# Patient Record
Sex: Female | Born: 1980 | Race: White | Hispanic: No | Marital: Married | Smoking: Current every day smoker
Health system: Southern US, Community
[De-identification: ages and names within clinical notes are randomized; demographics above are authoritative.]

## PROBLEM LIST (undated history)

## (undated) DIAGNOSIS — E785 Hyperlipidemia, unspecified: Secondary | ICD-10-CM

## (undated) DIAGNOSIS — J301 Allergic rhinitis due to pollen: Secondary | ICD-10-CM

## (undated) DIAGNOSIS — E669 Obesity, unspecified: Secondary | ICD-10-CM

## (undated) DIAGNOSIS — E039 Hypothyroidism, unspecified: Secondary | ICD-10-CM

## (undated) DIAGNOSIS — K589 Irritable bowel syndrome without diarrhea: Secondary | ICD-10-CM

## (undated) DIAGNOSIS — F419 Anxiety disorder, unspecified: Secondary | ICD-10-CM

## (undated) DIAGNOSIS — F909 Attention-deficit hyperactivity disorder, unspecified type: Secondary | ICD-10-CM

## (undated) HISTORY — PX: APPENDECTOMY: SHX54

## (undated) HISTORY — DX: Hyperlipidemia, unspecified: E78.5

## (undated) HISTORY — DX: Anxiety disorder, unspecified: F41.9

## (undated) HISTORY — DX: Attention-deficit hyperactivity disorder, unspecified type: F90.9

## (undated) HISTORY — DX: Allergic rhinitis due to pollen: J30.1

## (undated) HISTORY — DX: Obesity, unspecified: E66.9

## (undated) HISTORY — PX: TONSILLECTOMY: SUR1361

## (undated) HISTORY — DX: Irritable bowel syndrome, unspecified: K58.9

## (undated) HISTORY — DX: Hypothyroidism, unspecified: E03.9

---
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
## 1999-07-22 ENCOUNTER — Other Ambulatory Visit: Admission: RE | Admit: 1999-07-22 | Discharge: 1999-07-22 | Payer: Self-pay | Admitting: Internal Medicine

## 1999-09-12 ENCOUNTER — Inpatient Hospital Stay (HOSPITAL_COMMUNITY): Admission: AD | Admit: 1999-09-12 | Discharge: 1999-09-12 | Payer: Self-pay | Admitting: Internal Medicine

## 1999-11-10 ENCOUNTER — Other Ambulatory Visit: Admission: RE | Admit: 1999-11-10 | Discharge: 1999-11-10 | Payer: Self-pay | Admitting: *Deleted

## 2000-04-14 ENCOUNTER — Inpatient Hospital Stay (HOSPITAL_COMMUNITY): Admission: AD | Admit: 2000-04-14 | Discharge: 2000-04-14 | Payer: Self-pay | Admitting: Obstetrics and Gynecology

## 2000-04-18 ENCOUNTER — Inpatient Hospital Stay (HOSPITAL_COMMUNITY): Admission: AD | Admit: 2000-04-18 | Discharge: 2000-04-18 | Payer: Self-pay | Admitting: Obstetrics and Gynecology

## 2000-06-14 ENCOUNTER — Inpatient Hospital Stay (HOSPITAL_COMMUNITY): Admission: AD | Admit: 2000-06-14 | Discharge: 2000-06-14 | Payer: Self-pay | Admitting: Obstetrics and Gynecology

## 2000-06-20 ENCOUNTER — Inpatient Hospital Stay (HOSPITAL_COMMUNITY): Admission: AD | Admit: 2000-06-20 | Discharge: 2000-06-20 | Payer: Self-pay | Admitting: Obstetrics and Gynecology

## 2000-06-20 ENCOUNTER — Inpatient Hospital Stay (HOSPITAL_COMMUNITY): Admission: AD | Admit: 2000-06-20 | Discharge: 2000-06-23 | Payer: Self-pay | Admitting: Obstetrics and Gynecology

## 2000-06-26 ENCOUNTER — Inpatient Hospital Stay (HOSPITAL_COMMUNITY): Admission: AD | Admit: 2000-06-26 | Discharge: 2000-06-26 | Payer: Self-pay | Admitting: Obstetrics and Gynecology

## 2000-10-21 ENCOUNTER — Emergency Department (HOSPITAL_COMMUNITY): Admission: EM | Admit: 2000-10-21 | Discharge: 2000-10-21 | Payer: Self-pay | Admitting: Neurology

## 2000-10-21 ENCOUNTER — Encounter: Payer: Self-pay | Admitting: Emergency Medicine

## 2000-11-30 ENCOUNTER — Other Ambulatory Visit: Admission: RE | Admit: 2000-11-30 | Discharge: 2000-11-30 | Payer: Self-pay | Admitting: Obstetrics and Gynecology

## 2001-01-28 ENCOUNTER — Encounter: Admission: RE | Admit: 2001-01-28 | Discharge: 2001-01-28 | Payer: Self-pay | Admitting: *Deleted

## 2001-01-28 ENCOUNTER — Encounter: Payer: Self-pay | Admitting: *Deleted

## 2001-06-06 ENCOUNTER — Ambulatory Visit: Admission: RE | Admit: 2001-06-06 | Discharge: 2001-06-06 | Payer: Self-pay | Admitting: *Deleted

## 2001-11-29 ENCOUNTER — Other Ambulatory Visit: Admission: RE | Admit: 2001-11-29 | Discharge: 2001-11-29 | Payer: Self-pay | Admitting: Obstetrics and Gynecology

## 2001-12-19 ENCOUNTER — Encounter: Payer: Self-pay | Admitting: Family Medicine

## 2001-12-19 ENCOUNTER — Encounter: Admission: RE | Admit: 2001-12-19 | Discharge: 2001-12-19 | Payer: Self-pay | Admitting: Family Medicine

## 2002-06-05 ENCOUNTER — Encounter: Payer: Self-pay | Admitting: Emergency Medicine

## 2002-06-05 ENCOUNTER — Emergency Department (HOSPITAL_COMMUNITY): Admission: EM | Admit: 2002-06-05 | Discharge: 2002-06-05 | Payer: Self-pay | Admitting: Emergency Medicine

## 2002-07-01 ENCOUNTER — Encounter: Admission: RE | Admit: 2002-07-01 | Discharge: 2002-07-01 | Payer: Self-pay

## 2002-09-08 ENCOUNTER — Ambulatory Visit (HOSPITAL_COMMUNITY): Admission: RE | Admit: 2002-09-08 | Discharge: 2002-09-08 | Payer: Self-pay | Admitting: Endocrinology

## 2002-09-08 ENCOUNTER — Encounter: Payer: Self-pay | Admitting: Endocrinology

## 2002-09-16 ENCOUNTER — Encounter: Payer: Self-pay | Admitting: Endocrinology

## 2002-09-16 ENCOUNTER — Ambulatory Visit (HOSPITAL_COMMUNITY): Admission: RE | Admit: 2002-09-16 | Discharge: 2002-09-16 | Payer: Self-pay | Admitting: Endocrinology

## 2003-01-16 ENCOUNTER — Encounter: Admission: RE | Admit: 2003-01-16 | Discharge: 2003-01-16 | Payer: Self-pay

## 2003-02-17 ENCOUNTER — Other Ambulatory Visit: Admission: RE | Admit: 2003-02-17 | Discharge: 2003-02-17 | Payer: Self-pay | Admitting: Obstetrics and Gynecology

## 2003-07-10 ENCOUNTER — Emergency Department (HOSPITAL_COMMUNITY): Admission: EM | Admit: 2003-07-10 | Discharge: 2003-07-10 | Payer: Self-pay | Admitting: Family Medicine

## 2003-10-01 ENCOUNTER — Ambulatory Visit (HOSPITAL_COMMUNITY): Admission: RE | Admit: 2003-10-01 | Discharge: 2003-10-01 | Payer: Self-pay | Admitting: Orthopedic Surgery

## 2004-05-04 ENCOUNTER — Other Ambulatory Visit: Admission: RE | Admit: 2004-05-04 | Discharge: 2004-05-04 | Payer: Self-pay | Admitting: Obstetrics and Gynecology

## 2005-05-01 HISTORY — PX: CHOLECYSTECTOMY: SHX55

## 2005-07-03 ENCOUNTER — Emergency Department (HOSPITAL_COMMUNITY): Admission: EM | Admit: 2005-07-03 | Discharge: 2005-07-04 | Payer: Self-pay | Admitting: Emergency Medicine

## 2005-07-10 ENCOUNTER — Ambulatory Visit (HOSPITAL_COMMUNITY): Admission: RE | Admit: 2005-07-10 | Discharge: 2005-07-10 | Payer: Self-pay | Admitting: Gastroenterology

## 2005-07-12 ENCOUNTER — Ambulatory Visit (HOSPITAL_COMMUNITY): Admission: RE | Admit: 2005-07-12 | Discharge: 2005-07-12 | Payer: Self-pay | Admitting: Gastroenterology

## 2005-08-03 ENCOUNTER — Encounter (INDEPENDENT_AMBULATORY_CARE_PROVIDER_SITE_OTHER): Payer: Self-pay | Admitting: Specialist

## 2005-08-03 ENCOUNTER — Ambulatory Visit (HOSPITAL_COMMUNITY): Admission: RE | Admit: 2005-08-03 | Discharge: 2005-08-03 | Payer: Self-pay | Admitting: General Surgery

## 2005-09-08 ENCOUNTER — Emergency Department (HOSPITAL_COMMUNITY): Admission: EM | Admit: 2005-09-08 | Discharge: 2005-09-08 | Payer: Self-pay | Admitting: Family Medicine

## 2006-01-14 ENCOUNTER — Emergency Department (HOSPITAL_COMMUNITY): Admission: EM | Admit: 2006-01-14 | Discharge: 2006-01-14 | Payer: Self-pay | Admitting: Family Medicine

## 2006-04-04 ENCOUNTER — Inpatient Hospital Stay (HOSPITAL_COMMUNITY): Admission: AD | Admit: 2006-04-04 | Discharge: 2006-04-04 | Payer: Self-pay | Admitting: Obstetrics and Gynecology

## 2006-04-13 ENCOUNTER — Inpatient Hospital Stay (HOSPITAL_COMMUNITY): Admission: AD | Admit: 2006-04-13 | Discharge: 2006-04-13 | Payer: Self-pay | Admitting: Obstetrics and Gynecology

## 2006-04-16 ENCOUNTER — Inpatient Hospital Stay (HOSPITAL_COMMUNITY): Admission: AD | Admit: 2006-04-16 | Discharge: 2006-04-16 | Payer: Self-pay | Admitting: Obstetrics and Gynecology

## 2006-04-19 ENCOUNTER — Inpatient Hospital Stay (HOSPITAL_COMMUNITY): Admission: AD | Admit: 2006-04-19 | Discharge: 2006-04-19 | Payer: Self-pay | Admitting: Obstetrics and Gynecology

## 2006-04-26 ENCOUNTER — Inpatient Hospital Stay (HOSPITAL_COMMUNITY): Admission: AD | Admit: 2006-04-26 | Discharge: 2006-04-26 | Payer: Self-pay | Admitting: Obstetrics and Gynecology

## 2006-10-05 ENCOUNTER — Inpatient Hospital Stay (HOSPITAL_COMMUNITY): Admission: AD | Admit: 2006-10-05 | Discharge: 2006-10-05 | Payer: Self-pay | Admitting: Obstetrics & Gynecology

## 2006-12-10 ENCOUNTER — Inpatient Hospital Stay (HOSPITAL_COMMUNITY): Admission: AD | Admit: 2006-12-10 | Discharge: 2006-12-10 | Payer: Self-pay | Admitting: Obstetrics and Gynecology

## 2007-01-10 ENCOUNTER — Encounter: Admission: RE | Admit: 2007-01-10 | Discharge: 2007-01-10 | Payer: Self-pay | Admitting: Obstetrics and Gynecology

## 2007-03-07 ENCOUNTER — Inpatient Hospital Stay (HOSPITAL_COMMUNITY): Admission: AD | Admit: 2007-03-07 | Discharge: 2007-03-10 | Payer: Self-pay | Admitting: Obstetrics and Gynecology

## 2007-03-11 ENCOUNTER — Encounter (HOSPITAL_COMMUNITY): Admission: RE | Admit: 2007-03-11 | Discharge: 2007-04-10 | Payer: Self-pay | Admitting: Obstetrics and Gynecology

## 2007-04-17 ENCOUNTER — Emergency Department (HOSPITAL_COMMUNITY): Admission: EM | Admit: 2007-04-17 | Discharge: 2007-04-17 | Payer: Self-pay | Admitting: Family Medicine

## 2007-11-18 ENCOUNTER — Ambulatory Visit (HOSPITAL_COMMUNITY): Admission: RE | Admit: 2007-11-18 | Discharge: 2007-11-18 | Payer: Self-pay | Admitting: Gastroenterology

## 2007-12-20 ENCOUNTER — Emergency Department (HOSPITAL_COMMUNITY): Admission: EM | Admit: 2007-12-20 | Discharge: 2007-12-20 | Payer: Self-pay | Admitting: Emergency Medicine

## 2008-01-15 ENCOUNTER — Encounter (INDEPENDENT_AMBULATORY_CARE_PROVIDER_SITE_OTHER): Payer: Self-pay | Admitting: Gastroenterology

## 2008-01-15 ENCOUNTER — Ambulatory Visit (HOSPITAL_COMMUNITY): Admission: RE | Admit: 2008-01-15 | Discharge: 2008-01-15 | Payer: Self-pay | Admitting: Gastroenterology

## 2008-03-04 ENCOUNTER — Encounter: Admission: RE | Admit: 2008-03-04 | Discharge: 2008-03-04 | Payer: Self-pay | Admitting: Family Medicine

## 2008-05-16 ENCOUNTER — Emergency Department (HOSPITAL_COMMUNITY): Admission: EM | Admit: 2008-05-16 | Discharge: 2008-05-16 | Payer: Self-pay | Admitting: Emergency Medicine

## 2008-05-18 ENCOUNTER — Ambulatory Visit: Payer: Self-pay | Admitting: Cardiology

## 2008-05-18 LAB — CONVERTED CEMR LAB
TSH: 0.21 microintl units/mL — ABNORMAL LOW (ref 0.35–5.50)
Total CHOL/HDL Ratio: 3.8

## 2008-05-26 ENCOUNTER — Ambulatory Visit: Payer: Self-pay | Admitting: Cardiology

## 2008-06-09 ENCOUNTER — Ambulatory Visit: Payer: Self-pay | Admitting: Internal Medicine

## 2008-06-09 ENCOUNTER — Encounter: Payer: Self-pay | Admitting: Cardiology

## 2008-06-09 ENCOUNTER — Ambulatory Visit (HOSPITAL_COMMUNITY): Admission: RE | Admit: 2008-06-09 | Discharge: 2008-06-09 | Payer: Self-pay | Admitting: Cardiology

## 2008-06-14 DIAGNOSIS — E039 Hypothyroidism, unspecified: Secondary | ICD-10-CM | POA: Insufficient documentation

## 2008-06-15 ENCOUNTER — Ambulatory Visit: Payer: Self-pay | Admitting: Family Medicine

## 2008-06-15 DIAGNOSIS — L259 Unspecified contact dermatitis, unspecified cause: Secondary | ICD-10-CM

## 2008-06-15 DIAGNOSIS — J309 Allergic rhinitis, unspecified: Secondary | ICD-10-CM | POA: Insufficient documentation

## 2008-06-15 DIAGNOSIS — F411 Generalized anxiety disorder: Secondary | ICD-10-CM | POA: Insufficient documentation

## 2008-07-15 ENCOUNTER — Ambulatory Visit: Payer: Self-pay | Admitting: Family Medicine

## 2008-07-15 DIAGNOSIS — M545 Low back pain: Secondary | ICD-10-CM

## 2008-09-29 ENCOUNTER — Encounter (INDEPENDENT_AMBULATORY_CARE_PROVIDER_SITE_OTHER): Payer: Self-pay | Admitting: General Surgery

## 2008-09-29 ENCOUNTER — Observation Stay (HOSPITAL_COMMUNITY): Admission: EM | Admit: 2008-09-29 | Discharge: 2008-09-30 | Payer: Self-pay | Admitting: Emergency Medicine

## 2008-10-12 ENCOUNTER — Encounter: Payer: Self-pay | Admitting: Family Medicine

## 2009-02-16 ENCOUNTER — Telehealth: Payer: Self-pay | Admitting: Family Medicine

## 2009-02-22 ENCOUNTER — Encounter: Payer: Self-pay | Admitting: Family Medicine

## 2009-03-10 ENCOUNTER — Ambulatory Visit: Payer: Self-pay | Admitting: Family Medicine

## 2009-05-06 ENCOUNTER — Ambulatory Visit: Payer: Self-pay | Admitting: Family Medicine

## 2009-05-06 DIAGNOSIS — J069 Acute upper respiratory infection, unspecified: Secondary | ICD-10-CM | POA: Insufficient documentation

## 2009-05-24 ENCOUNTER — Telehealth: Payer: Self-pay | Admitting: Family Medicine

## 2009-05-26 ENCOUNTER — Telehealth: Payer: Self-pay | Admitting: Family Medicine

## 2009-05-26 ENCOUNTER — Ambulatory Visit: Payer: Self-pay | Admitting: Family Medicine

## 2009-05-26 DIAGNOSIS — E1140 Type 2 diabetes mellitus with diabetic neuropathy, unspecified: Secondary | ICD-10-CM

## 2009-05-27 LAB — CONVERTED CEMR LAB
Calcium: 9.4 mg/dL (ref 8.4–10.5)
Creatinine, Ser: 0.6 mg/dL (ref 0.4–1.2)
GFR calc non Af Amer: 125.48 mL/min (ref >60)
Glucose, Bld: 213 mg/dL — ABNORMAL HIGH (ref 70–99)
Hgb A1c MFr Bld: 9.7 % — ABNORMAL HIGH (ref 4.6–6.5)
LDL Cholesterol: 94 mg/dL (ref 0–99)
Microalb Creat Ratio: 34.6 mg/g — ABNORMAL HIGH (ref 0.0–30.0)
Total CHOL/HDL Ratio: 4
Triglycerides: 152 mg/dL — ABNORMAL HIGH (ref 0.0–149.0)
VLDL: 30.4 mg/dL (ref 0.0–40.0)

## 2009-06-02 ENCOUNTER — Encounter: Admission: RE | Admit: 2009-06-02 | Discharge: 2009-06-08 | Payer: Self-pay | Admitting: Family Medicine

## 2009-06-08 ENCOUNTER — Encounter: Admission: RE | Admit: 2009-06-08 | Discharge: 2009-06-08 | Payer: Self-pay | Admitting: Physician Assistant

## 2009-06-09 ENCOUNTER — Encounter: Payer: Self-pay | Admitting: Family Medicine

## 2009-06-28 ENCOUNTER — Ambulatory Visit: Payer: Self-pay | Admitting: Family Medicine

## 2009-06-28 DIAGNOSIS — E785 Hyperlipidemia, unspecified: Secondary | ICD-10-CM

## 2009-07-07 ENCOUNTER — Encounter: Payer: Self-pay | Admitting: Family Medicine

## 2009-08-30 ENCOUNTER — Telehealth: Payer: Self-pay | Admitting: Family Medicine

## 2009-10-04 ENCOUNTER — Encounter: Payer: Self-pay | Admitting: Family Medicine

## 2009-11-10 ENCOUNTER — Encounter: Admission: RE | Admit: 2009-11-10 | Discharge: 2009-11-11 | Payer: Self-pay | Admitting: Family Medicine

## 2009-12-24 ENCOUNTER — Ambulatory Visit: Payer: Self-pay | Admitting: Family Medicine

## 2009-12-25 LAB — CONVERTED CEMR LAB
Hgb A1c MFr Bld: 6.6 % — ABNORMAL HIGH (ref 4.6–6.5)
TSH: 0.03 microintl units/mL — ABNORMAL LOW (ref 0.35–5.50)

## 2010-01-13 ENCOUNTER — Telehealth: Payer: Self-pay | Admitting: Family Medicine

## 2010-03-01 ENCOUNTER — Encounter: Payer: Self-pay | Admitting: Family Medicine

## 2010-05-18 ENCOUNTER — Other Ambulatory Visit: Payer: Self-pay | Admitting: Family Medicine

## 2010-05-18 ENCOUNTER — Ambulatory Visit
Admission: RE | Admit: 2010-05-18 | Discharge: 2010-05-18 | Payer: Self-pay | Source: Home / Self Care | Attending: Family Medicine | Admitting: Family Medicine

## 2010-05-18 LAB — HEMOGLOBIN A1C: Hgb A1c MFr Bld: 7.4 % — ABNORMAL HIGH (ref 4.6–6.5)

## 2010-05-18 LAB — TSH: TSH: 1.93 u[IU]/mL (ref 0.35–5.50)

## 2010-05-23 ENCOUNTER — Encounter: Payer: Self-pay | Admitting: Gastroenterology

## 2010-05-24 ENCOUNTER — Encounter (INDEPENDENT_AMBULATORY_CARE_PROVIDER_SITE_OTHER): Payer: Self-pay | Admitting: *Deleted

## 2010-05-24 ENCOUNTER — Telehealth (INDEPENDENT_AMBULATORY_CARE_PROVIDER_SITE_OTHER): Payer: Self-pay | Admitting: *Deleted

## 2010-05-31 NOTE — Progress Notes (Signed)
  Phone Note Call from Patient Call back at [phone]   Caller: Patient Call For: Dr.Dallys Nowakowski Summary of Call: Pt has run out of her Acucheck Aviva Test Strips.  She needs a rx called in to her pharmacy.  Her pharmacy is [facility].  She is on her way to [facility] and wants to know if it can be called in.  Please call pt. when rx is called in to pharmacy. Initial call taken by: Beau Fanny,  May 24, 2009 1:23 PM    New/Updated Medications: ACCU-CHEK AVIVA  STRP (GLUCOSE BLOOD) Use as directed. Prescriptions: ACCU-CHEK AVIVA  STRP (GLUCOSE BLOOD) Use as directed.  #1 box x 0   Entered and Authorized by:   Ruthe Mannan MD   Signed by:   Ruthe Mannan MD on 05/24/2009   Method used:   Electronically to        [facility]* (retail)       [address]       [address] [place] [address]mailing       [place], [place]  [address]       Ph: [phone]       Fax: [phone]   RxID:   [id number]

## 2010-05-31 NOTE — Consult Note (Signed)
Summary: [facility]  [facility]   Imported By: Lanelle Bal 06/14/2009 08:27:38  _____________________________________________________________________  External Attachment:    Type:   Image     Comment:   External Document

## 2010-05-31 NOTE — Progress Notes (Signed)
Summary: yeast infection  Phone Note Call from Patient Call back at [phone]   Caller: Patient Call For: Hannah Beat MD Summary of Call: Patient saw you this morning and was upset about the diabetes diagnosis and she forgot to tell you that she has a yeast infection has tried to get rid of it with the otc medication and it did for a while but has now come back again Initial call taken by: Benny Lennert CMA Duncan Dull),  May 26, 2009 10:37 AM  Follow-up for Phone Call        diflucan 150 mg, 1 by mouth x 1, may repeat if not gone in 1 week. Disp 1, 1 refill Follow-up by: Hannah Beat MD,  May 26, 2009 10:52 AM  Additional Follow-up for Phone Call Additional follow up Details #1::        Rx Called In Additional Follow-up by: Benny Lennert CMA Duncan Dull),  May 26, 2009 11:23 AM

## 2010-05-31 NOTE — Progress Notes (Signed)
Summary: refill request for celexa  Phone Note Refill Request Message from:  Patient  Refills Requested: Medication #1:  CITALOPRAM HYDROBROMIDE 20 MG TABS take one tablet by mouth daily Phoned request from pt, please send to [facility].  Initial call taken by: Lowella Petties CMA,  February 16, 2009 4:39 PM  Follow-up for Phone Call        patient notified Follow-up by: Benny Lennert CMA (AAMA),  February 17, 2009 8:05 AM    Prescriptions: CITALOPRAM HYDROBROMIDE 20 MG TABS (CITALOPRAM HYDROBROMIDE) take one tablet by mouth daily  #30 x 5   Entered and Authorized by:   Kerby Nora MD   Signed by:   Kerby Nora MD on 02/16/2009   Method used:   Electronically to        [facility]* (retail)       [address]       [address] Shipping/mailing       [place], [place]  [address]       Ph: [phone]       Fax: [phone]   RxID:   [id number]

## 2010-05-31 NOTE — Assessment & Plan Note (Signed)
Summary: SUGARS UP  CYD   Vital Signs:  Patient profile:   30 year old female Height:      65.5 inches Weight:      229.0 pounds BMI:     37.66 Temp:     98.0 degrees F oral Pulse rate:   80 / minute Pulse rhythm:   regular BP sitting:   120 / 78  (left arm) Cuff size:   large  Vitals Entered By: Benny Lennert CMA Duncan Dull) (May 26, 2009 9:54 AM)  History of Present Illness: Chief complaint sugars elevated  30 year old with new onset DM 2:  Had some gestational DM with her son who is two. Woke up and was seeing spots and her BS was 289. Then checked a few other times and it is still high.  Stopped all carbs or sugars.   Flu shot - had  Needs pneumonia  Diabetes Management History:      The patient is a 30 years old female who comes in for evaluation of DM Type 2.  She has not been enrolled in the "Diabetic Education Program".  She states understanding of dietary principles but she is not following the appropriate diet.  No sensory loss is reported.  Self foot exams are not being performed.  She is checking home blood sugars.  She says that she is not exercising regularly.        Reported hypoglycemic symptoms include nausea and weakness.  Hyperglycemic symptoms include polyuria and blurred vision.  Frequency of hyperglycemic symptoms are reported to be fairly frequently.        Symptoms which suggest diabetic complications include GI-nausea/vomiting/bloating and lightheadedness/orthostatic.  Since her last visit, no infections have occurred.  She's had no treatment plan problems.  No changes have been made to her treatment plan since last visit.  Dietary compliance is reported to be good.    Preventive Screening-Counseling & Management  Caffeine-Diet-Exercise     Does Patient Exercise: no  Allergies (verified): No Known Drug Allergies  Past History:  Past medical, surgical, family and social histories (including risk factors) reviewed, and no changes noted (except as  noted below).  Past Medical History: Diabetes Mellitus, Type 2 Hypothyroidism Obesity Anxiety IBS Allergic rhinitis  Cardiology = Dr. Marca Ancona Endo = Dr. Chari Manning / GYN = Dr. Leslie Dales = Dr. Loreta Ave  Past Surgical History: Reviewed history from 06/15/2008 and no changes required. Tonsillectomy, 30 yrs old Cholecytectomy, 07  Chest pain, MC, 02/10, neg Echo, 06/2008, Neg  Family History: Reviewed history from 06/15/2008 and no changes required. m, HTN BRCA, GM DM, P, GP  Social History: Reviewed history from 06/15/2008 and no changes required. 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 GU:  Complains of urinary frequency. Endo:  Complains of excessive thirst, excessive urination, and polyuria.  Physical Exam  Additional Exam:  GEN: WDWN, NAD, Non-toxic, A & O x 3 HEENT: Atraumatic, Normocephalic. Neck supple. No masses, No LAD. Ears and Nose: No external deformity. CV: RRR, No M/G/R. No JVD. No thrill. No extra heart sounds. PULM: CTA B, no wheezes, crackles, rhonchi. No retractions. No resp. distress. No accessory muscle use. EXTR: No c/c/e NEURO: Normal gait.  PSYCH: Normally interactive. Conversant. Not depressed or anxious appearing.  Calm demeanor.     Impression &  Recommendations:  Problem # 1:  DIABETES MELLITUS, TYPE II (ICD-250.00) Assessment New New onset DM Diabetic teaching Titrate up metformin  Her updated medication list for this problem includes:    Metformin Hcl 500 Mg Xr24h-tab (Metformin hcl) .Marland Kitchen... 2 by mouth daily  Orders: Venipuncture (29562) TLB-Lipid Panel (80061-LIPID) TLB-BMP (Basic Metabolic Panel-BMET) (80048-METABOL) TLB-Microalbumin/Creat Ratio, Urine (82043-MALB) TLB-A1C / Hgb A1C (Glycohemoglobin) (83036-A1C) Diabetic Clinic  Referral (Diabetic)  Problem # 2:  ECZEMA (ICD-692.9)  Her updated medication list for this problem includes:    Mometasone Furoate 0.1 % Oint (Mometasone furoate) .Marland Kitchen... Apply daily to skin as directed  Complete Medication List: 1)  Synthroid 112 Mcg Tabs (Levothyroxine sodium) .... 2 tabs daily ( ) 2)  Caltrate 600+d 600-400 Mg-unit Tabs (Calcium carbonate-vitamin d) .... Daily 3)  Mometasone Furoate 0.1 % Oint (Mometasone furoate) .... Apply daily to skin as directed 4)  Citalopram Hydrobromide 20 Mg Tabs (Citalopram hydrobromide) .... Take one tablet by mouth daily 5)  Vitamin D 1000 Unit Tabs (Cholecalciferol) .... 2,000 units a day 6)  Mirena 20 Mcg/24hr Iud (Levonorgestrel) 7)  Accu-chek Aviva Strp (Glucose blood) .... Check blood sugar two times a day, 90 day supply (250.00) 8)  Metformin Hcl 500 Mg Xr24h-tab (Metformin hcl) .... 2 by mouth daily 9)  Accucheck Aviva Lancets  .... Check bs two times a day, 250.00  Diabetes Management Assessment/Plan:      The following lipid goals have been established for the patient: Total cholesterol goal of 200; LDL cholesterol goal of 100; HDL cholesterol goal of 40; Triglyceride goal of 200.    Patient Instructions: 1)  Check a fasting blood sugar and after 1 meal 2 hours after eating 2)  Referral Appointment Information 3)  Day/Date: 4)  Time: 5)  Place/MD: 6)  Address: 7)  Phone/Fax: 8)  Patient given appointment information. Information/Orders faxed/mailed.  9)  Metformin XR 500 mg, 1 by mouth once a week, then increase to 1 by mouth two times a day  Prescriptions: ACCUCHECK AVIVA LANCETS Check BS two times a day, 250.00  #180 x 3   Entered and Authorized by:   Hannah Beat MD   Signed by:   Hannah Beat MD on 05/26/2009   Method used:   Print then Give to Patient   RxID:   [id number] ACCU-CHEK AVIVA  STRP (GLUCOSE BLOOD) Check blood sugar two times a day, 90 day supply (250.00)  #180 x 3   Entered and Authorized  by:   Hannah Beat MD   Signed by:   Hannah Beat MD on 05/26/2009   Method used:   Electronically to        [facility]* (retail)       [address]       [address] Shipping/mailing       [place], [place]  [address]       Ph: [phone]       Fax: [phone]   RxID:   [id number] MOMETASONE FUROATE 0.1 % OINT (MOMETASONE FUROATE) Apply daily to skin as directed  #3 tubes x 3   Entered and Authorized by:   Hannah Beat MD   Signed by:   Hannah Beat MD on 05/26/2009   Method used:   Electronically to        [facility]* (retail)       [address]       [address] Shipping/mailing  [place], [place]  [address]       Ph: [phone]       Fax: [phone]   RxID:   [id number] METFORMIN HCL 500 MG XR24H-TAB (METFORMIN HCL) 2 by mouth daily  #60 x 2   Entered and Authorized by:   Hannah Beat MD   Signed by:   Hannah Beat MD on 05/26/2009   Method used:   Electronically to        [facility]* (retail)       [address]       [address] Shipping/mailing       [place], [place]  [address]       Ph: [phone]       Fax: [phone]   RxID:   [id number]   Current Allergies (reviewed today): No known allergies

## 2010-05-31 NOTE — Miscellaneous (Signed)
called in chantix  heather, ask the patient about if she has a glucometer. at this point with DM 2, she does not need to routinely check her blood more than spot checks, reasonable to do every few weeks to check FBS and 2 hour post meal.  OK to call in a generic glucometer if she needs one. Lancets #100 Strips #100 if needed  Hannah Beat MD  October 04, 2009 6:09 PM   Clinical Lists Changes  Medications: Added new medication of CHANTIX STARTING MONTH PAK 0.5 MG X 11 & 1 MG X 42  MISC (VARENICLINE TARTRATE) 0.5mg  by mouth once daily for 3 days, then twice daily for 4 days and then 1mg  by mouth 2 times daily - Signed Added new medication of CHANTIX 1 MG TABS (VARENICLINE TARTRATE) 1 tab by mouth 2 times daily (FOR FOLLOWING MONTH AFTER STARTER PACK) - Signed Rx of CHANTIX STARTING MONTH PAK 0.5 MG X 11 & 1 MG X 42  MISC (VARENICLINE TARTRATE) 0.5mg  by mouth once daily for 3 days, then twice daily for 4 days and then 1mg  by mouth 2 times daily;  #1 pack x 0;  Signed;  Entered by: Hannah Beat MD;  Authorized by: Hannah Beat MD;  Method used: Electronically to [facility]*, [address] Shipping/mailing, [place], [place]  [address], Ph: [phone], Fax: [phone] Rx of CHANTIX 1 MG TABS (VARENICLINE TARTRATE) 1 tab by mouth 2 times daily (FOR FOLLOWING MONTH AFTER STARTER PACK);  #60 x 3;  Signed;  Entered by: Hannah Beat MD;  Authorized by: Hannah Beat MD;  Method used: Electronically to [facility]*, [address], [address] Shipping/mailing, [place], [place]  [address], Ph: [phone], Fax: [phone]    Prescriptions: CHANTIX 1 MG TABS (VARENICLINE TARTRATE) 1 tab by mouth 2 times daily (FOR FOLLOWING MONTH AFTER STARTER PACK)  #60 x 3   Entered and Authorized by:   Hannah Beat MD   Signed by:   Hannah Beat MD on 10/04/2009   Method used:   Electronically to        [facility]*  (retail)       [address]       [address] Shipping/mailing       [place], [place]  [address]       Ph: [phone]       Fax: [phone]   RxID:   [id number] CHANTIX STARTING MONTH PAK 0.5 MG X 11 & 1 MG X 42  MISC (VARENICLINE TARTRATE) 0.5mg  by mouth once daily for 3 days, then twice daily for 4 days and then 1mg  by mouth 2 times daily  #1 pack x 0   Entered and Authorized by:   Hannah Beat MD   Signed by:   Hannah Beat MD on 10/04/2009   Method used:   Electronically to        [facility]* (retail)       [address]       [address] Shipping/mailing       [place], [place]  [address]       Ph: [phone]       Fax: [phone]   RxID:   [id number]  Advised pt.  She says she has a glucometer and doesnt need supplies at this time.            Lowella Petties CMA  October 05, 2009 8:33 AM

## 2010-05-31 NOTE — Assessment & Plan Note (Signed)
Summary: follow up   Vital Signs:  Patient profile:   30 year old female Height:      65.5 inches Weight:      217.38 pounds BMI:     35.75 Temp:     97.8 degrees F oral Pulse rate:   76 / minute Pulse rhythm:   regular BP sitting:   110 / 76  (right arm) Cuff size:   large  Vitals Entered By: Linde Gillis CMA Duncan Dull) (June 28, 2009 3:13 PM) CC: follow up   History of Present Illness: 30 year old  A1c = 9.7 at last visit.  Metformin 1000 mg total  BP at goal Lipids in 90s LDL  Pneumovax   the patient, her  blood sugars is still a decrease, now approaching anywhere in the 120-160 range.  initially, she did have some mild nausea with the metformin, however this is subsided at this time point.  his last more than 20 pounds.   Clinical Review Panels:  Lipid Management   Cholesterol:  163 (05/26/2009)   LDL (bad choesterol):  94 (05/26/2009)   HDL (good cholesterol):  38.90 (05/26/2009)  Diabetes Management   HgBA1C:  9.7 (05/26/2009)   Creatinine:  0.6 (05/26/2009)   Last Pneumovax:  Pneumovax (06/28/2009)  Complete Metabolic Panel   Glucose:  213 (05/26/2009)   Sodium:  137 (05/26/2009)   Potassium:  3.9 (05/26/2009)   Chloride:  101 (05/26/2009)   CO2:  28 (05/26/2009)   BUN:  11 (05/26/2009)   Creatinine:  0.6 (05/26/2009)   Calcium:  9.4 (05/26/2009)   Allergies (verified): No Known Drug Allergies  Past History:  Past medical, surgical, family and social histories (including risk factors) reviewed, and no changes noted (except as noted below).  Past Medical History: Diabetes Mellitus, Type 2 Hypothyroidism Obesity Anxiety IBS Allergic rhinitis hyperlipidemia  Cardiology = Dr. Marca Ancona Endo = Dr. Chari Manning / GYN = Dr. Leslie Dales = Dr. Loreta Ave  Past Surgical History: Reviewed history from 06/15/2008 and no changes required. Tonsillectomy, 30 yrs old Cholecytectomy, 07  Chest pain, MC, 02/10, neg Echo, 06/2008,  Neg  Family History: Reviewed history from 06/15/2008 and no changes required. m, HTN BRCA, GM DM, P, GP  Social History: Reviewed history from 06/15/2008 and no changes required. 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 GI: No n/v/d Pulm: No SOB, cough, wheezing Interactive and getting along well at home.  Otherwise, ROS is as per the HPI.   Physical Exam  Additional Exam:  GEN: WDWN, NAD, Non-toxic, A & O x 3 HEENT: Atraumatic, Normocephalic. Neck supple. No masses, No LAD. Ears and Nose: No external deformity. CV: RRR, No M/G/R. No JVD. No thrill. No extra heart sounds. PULM: CTA B, no wheezes, crackles, rhonchi. No retractions. No resp. distress. No accessory muscle use. EXTR: No c/c/e NEURO: Normal gait.  PSYCH: Normally interactive. Conversant. Not depressed or anxious appearing.  Calm demeanor.     Impression & Recommendations:  Problem # 1:  DIABETES MELLITUS, TYPE II (ICD-250.00) unstable, not ago, we increased her metformin to a total of 1500 mg p.o. daily of the extended release version.  Encourage weight loss. She's also been enrolled in the [facility] diabetes management program, which I encouraged.  Her updated medication list  for this problem includes:    Metformin Hcl 500 Mg Xr24h-tab (Metformin hcl) .Marland KitchenMarland KitchenMarland KitchenMarland Kitchen 3 by mouth daily  Problem # 2:  HYPERLIPIDEMIA (ICD-272.4) my goal.  At this point, the patient would like to pursue vigorous exercise weight loss, which I agree with, given her LDL of approximately 90, LDL goal of 70 is attainable  Complete Medication List: 1)  Synthroid 112 Mcg Tabs (Levothyroxine sodium) .... 2 tabs daily ( ) 2)  Caltrate 600+d 600-400 Mg-unit Tabs (Calcium carbonate-vitamin d) .... Daily 3)  Mometasone Furoate  0.1 % Oint (Mometasone furoate) .... Apply daily to skin as directed 4)  Citalopram Hydrobromide 20 Mg Tabs (Citalopram hydrobromide) .... Take one tablet by mouth daily 5)  Vitamin D 1000 Unit Tabs (Cholecalciferol) .... 2,000 units a day 6)  Mirena 20 Mcg/24hr Iud (Levonorgestrel) 7)  Accu-chek Aviva Strp (Glucose blood) .... Check blood sugar two times a day, 90 day supply (250.00) 8)  Metformin Hcl 500 Mg Xr24h-tab (Metformin hcl) .... 3 by mouth daily 9)  Accucheck Aviva Lancets  .... Check bs two times a day, 250.00  Other Orders: Pneumococcal Vaccine (30865) Admin 1st Vaccine (78469)  Patient Instructions: 1)  f/u 3 months 2)  TSH prior to visit ICD-9 : 244.9 3)  HgBA1c prior to visit  ICD-9: 250.00 Prescriptions: METFORMIN HCL 500 MG XR24H-TAB (METFORMIN HCL) 3 by mouth daily  #270 x 3   Entered and Authorized by:   Hannah Beat MD   Signed by:   Hannah Beat MD on 06/28/2009   Method used:   Electronically to        [facility]* (retail)       [address]       [address] Shipping/mailing       [place], [place]  [address]       Ph: [phone]       Fax: [phone]   RxID:   [id number]   Current Allergies (reviewed today): No known allergies    Orders Added: 1)  Pneumococcal Vaccine [90732] 2)  Admin 1st Vaccine [90471] 3)  Est. Patient Level IV [[address]951]    Immunizations Administered:  Pneumonia Vaccine:    Vaccine Type: Pneumovax    Site: right deltoid    Mfr: Merck    Dose: 0.5 ml    Route: IM    Given by: Linde Gillis CMA (AAMA)    Exp. Date: 12/13/2010    Lot #: [id number]    VIS given: 11/27/95 version given June 28, 2009.    Prevention & Chronic Care Immunizations   Influenza vaccine: Not documented   Influenza vaccine due: 03/31/2009    Tetanus booster: Not documented    Pneumococcal vaccine: Pneumovax  (06/28/2009)    Diabetes Mellitus   HgbA1C: 9.7  (05/26/2009)    Eye exam: Not documented     Foot exam: Not documented   High risk foot: Not documented   Foot care education: Not documented    Urine microalbumin/creatinine ratio: 34.6  (05/26/2009)    Diabetes flowsheet reviewed?: Yes   Progress toward A1C goal: Unchanged  Lipids   Total Cholesterol: 163  (05/26/2009)   LDL: 94  (05/26/2009)   LDL Direct: Not documented   HDL: 38.90  (05/26/2009)   Triglycerides: 152.0  (05/26/2009)    SGOT (AST): Not documented   SGPT (ALT): Not documented   Alkaline phosphatase: Not documented   Total bilirubin: Not documented    Lipid flowsheet reviewed?: Yes   Progress  toward LDL goal: Unchanged

## 2010-05-31 NOTE — Letter (Signed)
Summary: [facility]  [facility]   Imported By: Maryln Gottron 03/07/2010 15:56:54  _____________________________________________________________________  External Attachment:    Type:   Image     Comment:   External Document

## 2010-05-31 NOTE — Progress Notes (Signed)
Summary: needs order for glucometer  Phone Note Call from Patient Call back at [phone]   Caller: Patient Summary of Call: Pt has lost her glucometer and needs a new script for a true track, with strips and lancets.  She needs order sent to [facility]. Initial call taken by: Lowella Petties CMA,  January 13, 2010 4:30 PM  Follow-up for Phone Call        Can you oprint this out from EMR for my signiture? Follow-up by: Kerby Nora MD,  January 14, 2010 8:47 AM  Additional Follow-up for Phone Call Additional follow up Details #1::        printed in inbox for signature.Consuello Masse CMA   Additional Follow-up by: Benny Lennert CMA Duncan Dull),  January 14, 2010 9:16 AM    New/Updated Medications: * GLUCOMETER (DIABETES) 1 glucometer to check blood sugars Prescriptions: GLUCOMETER (DIABETES) 1 glucometer to check blood sugars  #1 x 0   Entered by:   Benny Lennert CMA (AAMA)   Authorized by:   Kerby Nora MD   Signed by:   Benny Lennert CMA (AAMA) on 01/14/2010   Method used:   Print then Give to Patient   RxID:   [id number]

## 2010-05-31 NOTE — Assessment & Plan Note (Signed)
Summary: DISCUSS DIABETES/CLE   Vital Signs:  Patient profile:   30 year old female Height:      65.5 inches Weight:      196.6 pounds BMI:     32.33 Temp:     98.1 degrees F oral Pulse rate:   76 / minute Pulse rhythm:   regular BP sitting:   120 / 70  (left arm) Cuff size:   large  Vitals Entered By: Benny Lennert CMA Duncan Dull) (December 24, 2009 9:58 AM)  History of Present Illness: Chief complaint Discuss diabetes  30 year old female:  f/u   DM: 50 pound weight loss, BS running great  Thyroid: asymptomatic  flu vaccine  115-120 fasting      Diabetes Management History:      The patient is a 30 years old female who comes in for evaluation of DM Type 2.  She is (or has been) enrolled in the "Diabetic Education Program".  She states understanding of dietary principles and is following her diet appropriately.  No sensory loss is reported.  Self foot exams are being performed.  She is checking home blood sugars.  She says that she is exercising.        Hypoglycemic symptoms are not occurring.        There are no symptoms to suggest diabetic complications.  The following changes have been made to her treatment plan since last visit: diet changes and exercise program.  Treatment plan changes were initiated by patient, initiated by MD, and initiated by D.E.P..  Dietary compliance is reported to be excellent.    Preventive Screening-Counseling & Management  Caffeine-Diet-Exercise     Does Patient Exercise: yes  Allergies (verified): No Known Drug Allergies  Past History:  Past medical, surgical, family and social histories (including risk factors) reviewed, and no changes noted (except as noted below).  Past Medical History: Reviewed history from 06/28/2009 and no changes required. Diabetes Mellitus, Type 2 Hypothyroidism Obesity Anxiety IBS Allergic rhinitis hyperlipidemia  Cardiology = Dr. Marca Ancona Endo = Dr. Chari Manning / GYN = Dr. Leslie Dales = Dr. Loreta Ave  Past Surgical History: Reviewed history from 06/15/2008 and no changes required. Tonsillectomy, 30 yrs old Cholecytectomy, 07  Chest pain, [facility], 02/10, neg Echo, 06/2008, Neg  Family History: Reviewed history from 06/15/2008 and no changes required. m, HTN BRCA, GM DM, P, GP  Social History: Reviewed history from 06/15/2008 and no changes required. 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 CV: No chest pain or SOB GI: No noted N or V Otherwise, pertinent positives and negatives are noted in the HPI.   Physical Exam  General:  GEN: WDWN, NAD, Non-toxic, A & O x 3 HEENT: Atraumatic, Normocephalic. Neck supple. No masses, No LAD. Ears and Nose: No external deformity. CV: RRR, No M/G/R. No JVD. No thrill. No extra heart sounds. PULM: CTA B, no wheezes, crackles, rhonchi. No retractions. No resp. distress. No accessory muscle use. EXTR: No c/c/e NEURO: Normal gait.  PSYCH: Normally interactive. Conversant. Not depressed or anxious appearing.  Calm demeanor.    Diabetes Management Exam:    Foot Exam (with socks and/or shoes not present):       Sensory-Pinprick/Light touch:          Left medial foot (  L-4): normal          Left dorsal foot (L-5): normal          Left lateral foot (S-1): normal          Right medial foot (L-4): normal          Right dorsal foot (L-5): normal          Right lateral foot (S-1): normal       Sensory-Monofilament:          Left foot: normal          Right foot: normal       Inspection:          Left foot: normal          Right foot: normal       Nails:          Left foot: normal          Right foot: normal    Eye Exam:       Eye Exam done elsewhere   Impression & Recommendations:  Problem # 1:  DIABETES  MELLITUS, TYPE II (ICD-250.00) Assessment Unchanged add ace  Her updated medication list for this problem includes:    Metformin Hcl 500 Mg Xr24h-tab (Metformin hcl) .Marland KitchenMarland KitchenMarland KitchenMarland Kitchen 3 by mouth daily    Lisinopril 2.5 Mg Tabs (Lisinopril) .Marland Kitchen... 1 by mouth daily  Orders: Venipuncture ([address]) TLB-A1C / Hgb A1C (Glycohemoglobin) (83036-A1C)  Labs Reviewed: Creat: 0.6 (05/26/2009)    Reviewed HgBA1c results: 9.7 (05/26/2009)  Problem # 2:  HYPOTHYROIDISM (ICD-244.9) Assessment: Deteriorated at time of this note, TSH comes back low, and will need to decrease synthroid dosing  Her updated medication list for this problem includes:    Synthroid 112 Mcg Tabs (Levothyroxine sodium) .Marland Kitchen... 2 tabs daily ( )  Orders: TLB-TSH (Thyroid Stimulating Hormone) (84443-TSH)  Complete Medication List: 1)  Synthroid 112 Mcg Tabs (Levothyroxine sodium) .... 2 tabs daily ( ) 2)  Caltrate 600+d 600-400 Mg-unit Tabs (Calcium carbonate-vitamin d) .... Daily 3)  Mometasone Furoate 0.1 % Oint (Mometasone furoate) .... Apply daily to skin as directed 4)  Citalopram Hydrobromide 20 Mg Tabs (Citalopram hydrobromide) .... Take one tablet by mouth daily 5)  Vitamin D 1000 Unit Tabs (Cholecalciferol) .... 2,000 units a day 6)  Mirena 20 Mcg/24hr Iud (Levonorgestrel) 7)  Accu-chek Aviva Strp (Glucose blood) .... Check blood sugar two times a day, 90 day supply (250.00) 8)  Metformin Hcl 500 Mg Xr24h-tab (Metformin hcl) .... 3 by mouth daily 9)  Accucheck Aviva Lancets  .... Check bs two times a day, 250.00 10)  Lisinopril 2.5 Mg Tabs (Lisinopril) .Marland Kitchen.. 1 by mouth daily  Diabetes Management Assessment/Plan:      The following lipid goals have been established for the patient: Total cholesterol goal of 200; LDL cholesterol goal of 100; HDL cholesterol goal of 40; Triglyceride goal of 200.    Patient Instructions: 1)  f/u 6 months Prescriptions: LISINOPRIL 2.5 MG TABS (LISINOPRIL) 1 by mouth daily  #90 x 3    Entered and Authorized by:   Hannah Beat MD   Signed by:   Hannah Beat MD on 12/24/2009   Method used:   Electronically to        [facility]* (retail)       [address]       [address] Shipping/mailing       [place], [place]  [address]       Ph: [phone]  Fax: [phone]   RxID:   [id number]   Current Allergies (reviewed today): No known allergies

## 2010-05-31 NOTE — Progress Notes (Signed)
Summary: Larey Seat and hurt thumb  Phone Note Call from Patient Call back at [phone] cell   Caller: Patient Call For: Hannah Beat MD Summary of Call: Patient says that she fell on her left hand last weeks with all her her weight when she fell.  Thumb is swollen just a little now, has an open sore on it, and is giving her problems.  Offered her an appt with Dr. Patsy Lager for Wednesday 09/01/2009 but she refused.  She says she wants to be seen today because she has to go to work.  Please advise. Initial call taken by: Linde Gillis CMA [facility] Dull),  Aug 30, 2009 8:56 AM  Follow-up for Phone Call        No available appts.  cannot see any more patients.  If she can bend finger, move without exposed bone, Wed. likely OK. Otherwise if urgent eval needed, UC vs [facility] is reasonable Follow-up by: Hannah Beat MD,  Aug 30, 2009 8:59 AM  Additional Follow-up for Phone Call Additional follow up Details #1::        Patient advised as instructed.  She says that she cannot wait until Wednesday so she will just go to Urgent Care.   Additional Follow-up by: Linde Gillis CMA Duncan Dull),  Aug 30, 2009 9:20 AM

## 2010-05-31 NOTE — Letter (Signed)
Summary: Med Link  Med Link   Imported By: Lanelle Bal 07/14/2009 13:20:15  _____________________________________________________________________  External Attachment:    Type:   Image     Comment:   External Document

## 2010-05-31 NOTE — Assessment & Plan Note (Signed)
Summary: CONGESTION/CLE   Vital Signs:  Patient profile:   30 year old female Height:      65.5 inches Weight:      233.13 pounds BMI:     38.34 Temp:     97.5 degrees F oral Pulse rate:   84 / minute Pulse rhythm:   regular BP sitting:   112 / 88  (left arm) Cuff size:   large  Vitals Entered By: Delilah Shan CMA [facility] Dull) (May 06, 2009 11:03 AM) CC: Congestion   History of Present Illness: 30 yo with 7 day h/o productive cough, sinus congestion. No fevers, no chills, no shortness of breath unless she smoked.  No wheezing this time. Does have an albuterol inhaler at home, has not needed to use it.  Current Medications (verified): 1)  Synthroid 112 Mcg Tabs (Levothyroxine Sodium) .... 2 Tabs Daily ( ) 2)  Caltrate 600+d 600-400 Mg-Unit Tabs (Calcium Carbonate-Vitamin D) .... Daily 3)  Mometasone Furoate 0.1 % Oint (Mometasone Furoate) .... Apply Daily To Skin As Directed 4)  Citalopram Hydrobromide 20 Mg Tabs (Citalopram Hydrobromide) .... Take One Tablet By Mouth Daily 5)  Vitamin D 1000 Unit  Tabs (Cholecalciferol) .... 2,000 Units A Day 6)  Mirena 20 Mcg/24hr Iud (Levonorgestrel) 7)  Ventolin Hfa 108 (90 Base) Mcg/act Aers (Albuterol Sulfate) .... 2 Puffs Every 4-6 Hours As Needed. 8)  Azithromycin 250 Mg  Tabs (Azithromycin) .... 2 By  Mouth Today and Then 1 Daily For 4 Days  Allergies (verified): No Known Drug Allergies  Review of Systems      See HPI General:  Denies chills and fever. ENT:  Complains of sinus pressure; denies sore throat. Resp:  Complains of cough; denies shortness of breath, sputum productive, and wheezing.  Physical Exam  General:  Well-developed,well-nourished,in no acute distress; alert,appropriate and cooperative throughout examination Ears:  clear fluid bilaterally Mouth:  post nasal drip Lungs:  Normal respiratory effort, chest expands symmetrically. Lungs are clear to auscultation, no crackles or wheezes. Heart:  Normal rate and  regular rhythm. S1 and S2 normal without gallop, murmur, click, rub or other extra sounds. Psych:  Cognition and judgment appear intact. Alert and cooperative with normal attention span and concentration. No apparent delusions, illusions, hallucinations   Impression & Recommendations:  Problem # 1:  URI (ICD-465.9) Assessment New given h/o asthma/ bronchitis with treat with Zpack. COntinue mucinex. Red flags given for follow up.  Complete Medication List: 1)  Synthroid 112 Mcg Tabs (Levothyroxine sodium) .... 2 tabs daily ( ) 2)  Caltrate 600+d 600-400 Mg-unit Tabs (Calcium carbonate-vitamin d) .... Daily 3)  Mometasone Furoate 0.1 % Oint (Mometasone furoate) .... Apply daily to skin as directed 4)  Citalopram Hydrobromide 20 Mg Tabs (Citalopram hydrobromide) .... Take one tablet by mouth daily 5)  Vitamin D 1000 Unit Tabs (Cholecalciferol) .... 2,000 units a day 6)  Mirena 20 Mcg/24hr Iud (Levonorgestrel) 7)  Ventolin Hfa 108 (90 Base) Mcg/act Aers (Albuterol sulfate) .... 2 puffs every 4-6 hours as needed. 8)  Azithromycin 250 Mg Tabs (Azithromycin) .... 2 by  mouth today and then 1 daily for 4 days  Patient Instructions: 1)  Zpack 2)  Recommended voice rest for laryngitis, Warm honey tea.  Drink lots of fluids.  Treat sympotmatically  with guafenesin, nasal saline irrigation, and Tylenol.  Cough suppressant at night. Prescriptions: AZITHROMYCIN 250 MG  TABS (AZITHROMYCIN) 2 by  mouth today and then 1 daily for 4 days  #6 x 0   Entered and  Authorized by:   Ruthe Mannan MD   Signed by:   Ruthe Mannan MD on 05/06/2009   Method used:   Electronically to        [facility]* (retail)       [address]       [place], [place]  [address]       Ph: [phone]       Fax: [phone]   RxID:   [id number]   Current Allergies (reviewed today): No known allergies

## 2010-05-31 NOTE — Progress Notes (Signed)
Summary: [facility] patient  Phone Note Call from Patient Call back at [phone]   Caller: Patient Call For: Hannah Beat MD Summary of Call: patient called and said that saturday she woke up and felt dizzy,saw stars. Patient checked sugar and it was 269 has checked 4 more times since then it has been above 250 and highest was 369. Patient says that she only felt bad on saturday and was really thirsty. She feels fine has appt on wednesday with dr [facility]. Just wanted to make sure she didnt need to be seen sooner. Initial call taken by: Benny Lennert CMA Duncan Dull),  May 24, 2009 10:32 AM  Follow-up for Phone Call        Does she have a diagnosis of diabetes and does she take any mediction for it? Cannot find it on med list. Follow-up by: Ruthe Mannan MD,  May 24, 2009 10:46 AM  Additional Follow-up for Phone Call Additional follow up Details #1::        no she had gestional diabetes about 2 years ago and hasnt had anymore problems until now Additional Follow-up by: Benny Lennert CMA Duncan Dull),  May 24, 2009 11:08 AM    Additional Follow-up for Phone Call Additional follow up Details #2::    If not vomiting and not having any altered mental status than ok to wait for [facility] or [facility], but I would prefer for her to be seen tomorrow for labs and starting diabetes medication.  Drink a lot of fluids until she is seen. Follow-up by: Ruthe Mannan MD,  May 24, 2009 11:12 AM  Additional Follow-up for Phone Call Additional follow up Details #3:: Details for Additional Follow-up Action Taken: patient says she can not come tomorrow she has to work Additional Follow-up by: Benny Lennert CMA Duncan Dull),  May 24, 2009 11:17 AM   Then there are no recommendations I can make beside what I said previously if she cannot come in to see [place]. Ruthe Mannan MD  May 24, 2009 11:27 AM

## 2010-06-02 NOTE — Progress Notes (Signed)
Summary: wants antibiotic and cough syrup  Phone Note Call from Patient Call back at Home Phone [phone]   Caller: Patient Call For: Hannah Beat MD Summary of Call: Patient was seen on 05-18-10 with uri symptoms. She says that now if feels like it has all moved to her chest. She is wheezing, and has a terrible cough. She is asking if she could get an antibiotic and something to help with her cough called in to [facility]. Initial call taken by: Melody Comas,  May 24, 2010 10:48 AM  Follow-up for Phone Call        Given she is wheezing.. I would have her follow up before sending in antibiotics. I will forward this to [facility] who saw her to see if he feels differently. Follow-up by: Kerby Nora MD,  May 24, 2010 12:13 PM  Additional Follow-up for Phone Call Additional follow up Details #1::        i think that is reasonable. Additional Follow-up by: Hannah Beat MD,  May 24, 2010 1:37 PM    Additional Follow-up for Phone Call Additional follow up Details #2::    Patients husband advised.Consuello Masse CMA   Follow-up by: Benny Lennert CMA Duncan Dull),  May 24, 2010 2:09 PM   Appended Document: wants antibiotic and cough syrup Patient called back and said that it is rediculous for her to pay another co pay. She said that she gets this every year and just needs a z pack and cough medication. Patient was then advised i would as the doctor again when she said that she is only wheezing because she smokes and she uses an albuterol inhaler for this. Patietn then proceeded to say that she would just go to and URGENT CARE. I again told patient that i would send another note and she said that the pharmacy would be closed by the time i was able to get back in touch with her and then hang up.Consuello Masse CMA    Appended Document: wants antibiotic and cough syrup call to [facility]  azithromycin, 250 mg, 2 by mouth today, then 1 by mouth x 4 days,  #6 Tussionex suspension, 1 tsp by mouth at bedtime as needed cough. #240 mL. 0 refills  I spoke to her, know her well, saw her last week.  call in the above med[facility]. stable, mild wheezing.  Appended Document: wants antibiotic and cough syrup rx called to pharmacy.Consuello Masse CMA

## 2010-06-02 NOTE — Miscellaneous (Signed)
Summary: med list update- lancets  Clinical Lists Changes  Medications: Added new medication of ACCU-CHEK MULTICLIX LANCETS  MISC (LANCETS) check blood sugar twice daily     Prior Medications: CALTRATE 600+D 600-400 MG-UNIT TABS (CALCIUM CARBONATE-VITAMIN D) daily MOMETASONE FUROATE 0.1 % OINT (MOMETASONE FUROATE) Apply daily to skin as directed CITALOPRAM HYDROBROMIDE 20 MG TABS (CITALOPRAM HYDROBROMIDE) take one tablet by mouth daily VITAMIN D 1000 UNIT  TABS (CHOLECALCIFEROL) 2,000 units a day MIRENA 20 MCG/24HR IUD (LEVONORGESTREL)  METFORMIN HCL 500 MG XR24H-TAB (METFORMIN HCL) 3 by mouth daily LISINOPRIL 2.5 MG TABS (LISINOPRIL) 1 by mouth daily LEVOTHYROXINE SODIUM 200 MCG TABS (LEVOTHYROXINE SODIUM) 1 by mouth daily ACCU-CHEK MULTICLIX LANCETS  MISC (LANCETS) check blood sugar twice daily Current Allergies: No known allergies

## 2010-06-02 NOTE — Assessment & Plan Note (Signed)
Summary: URI/alc   Vital Signs:  Patient profile:   30 year old female Height:      65.5 inches Weight:      187.50 pounds BMI:     30.84 Temp:     97.8 degrees F oral Pulse rate:   76 / minute Pulse rhythm:   regular BP sitting:   110 / 80  (left arm) Cuff size:   large  Vitals Entered By: Benny Lennert CMA Duncan Dull) (May 18, 2010 10:07 AM)  History of Present Illness: Chief complaint ? upper respiratory infection with ear pain  30 year old female:  URI, right ear. a little cough. This 30 Years Old White Female comes in today with complaints of cough, runny nose, and sore throat.  R ear fullness, improving though  THyroid, hair falling out, needs recheck  DM< has been stable, some fluctuation with christmas eating  REVIEW OF SYSTEMS GEN: Acute illness details above. CV: No chest pain or SOB GI: No noted N or V Otherwise, pertinent positives and negatives are noted in the HPI.   GEN: WDWN, NAD, Non-toxic, A & O x 3 HEENT: Atraumatic, Normocephalic. Neck supple. No masses, No LAD. Serous fluid on TM B. Ears and Nose: No external deformity. CV: RRR, No M/G/R. No JVD. No thrill. No extra heart sounds. PULM: CTA B, no wheezes, crackles, rhonchi. No retractions. No resp. distress. No accessory muscle use. EXTR: No c/c/e NEURO: Normal gait.  PSYCH: Normally interactive. Conversant. Not depressed or anxious appearing.  Calm demeanor.    Allergies (verified): No Known Drug Allergies   Impression & Recommendations:  Problem # 1:  URI (ICD-465.9)  Instructed on symptomatic treatment. Call if symptoms persist or worsen.   Problem # 2:  DIABETES MELLITUS, TYPE II (ICD-250.00)  Her updated medication list for this problem includes:    Metformin Hcl 500 Mg Xr24h-tab (Metformin hcl) .Marland KitchenMarland KitchenMarland KitchenMarland Kitchen 3 by mouth daily    Lisinopril 2.5 Mg Tabs (Lisinopril) .Marland Kitchen... 1 by mouth daily  Orders: TLB-A1C / Hgb A1C (Glycohemoglobin) (83036-A1C)  Problem # 3:  HYPOTHYROIDISM  (ICD-244.9)  The following medications were removed from the medication list:    Synthroid 112 Mcg Tabs (Levothyroxine sodium) .Marland Kitchen... 2 tabs daily ( ) Her updated medication list for this problem includes:    Levothyroxine Sodium 200 Mcg Tabs (Levothyroxine sodium) .Marland Kitchen... 1 by mouth daily  Orders: Venipuncture ([address]) TLB-TSH (Thyroid Stimulating Hormone) (84443-TSH)  Complete Medication List: 1)  Caltrate 600+d 600-400 Mg-unit Tabs (Calcium carbonate-vitamin d) .... Daily 2)  Mometasone Furoate 0.1 % Oint (Mometasone furoate) .... Apply daily to skin as directed 3)  Citalopram Hydrobromide 20 Mg Tabs (Citalopram hydrobromide) .... Take one tablet by mouth daily 4)  Vitamin D 1000 Unit Tabs (Cholecalciferol) .... 2,000 units a day 5)  Mirena 20 Mcg/24hr Iud (Levonorgestrel) 6)  Metformin Hcl 500 Mg Xr24h-tab (Metformin hcl) .... 3 by mouth daily 7)  Lisinopril 2.5 Mg Tabs (Lisinopril) .Marland Kitchen.. 1 by mouth daily 8)  Levothyroxine Sodium 200 Mcg Tabs (Levothyroxine sodium) .Marland Kitchen.. 1 by mouth daily  Patient Instructions: 1)  f/u 6 months   Orders Added: 1)  Venipuncture [40[address]] 2)  TLB-TSH (Thyroid Stimulating Hormone) [84443-TSH] 3)  TLB-A1C / Hgb A1C (Glycohemoglobin) [83036-A1C] 4)  Est. Patient Level III [42595]    Current Allergies (reviewed today): No known allergies

## 2010-06-10 ENCOUNTER — Encounter (INDEPENDENT_AMBULATORY_CARE_PROVIDER_SITE_OTHER): Payer: Self-pay | Admitting: *Deleted

## 2010-06-10 ENCOUNTER — Encounter: Payer: Self-pay | Admitting: Family Medicine

## 2010-06-16 NOTE — Miscellaneous (Signed)
Summary: med list update  Clinical Lists Changes  Medications: Added new medication of TRUETEST TEST  STRP (GLUCOSE BLOOD) check blood sugar two times a day     Prior Medications: CALTRATE 600+D 600-400 MG-UNIT TABS (CALCIUM CARBONATE-VITAMIN D) daily MOMETASONE FUROATE 0.1 % OINT (MOMETASONE FUROATE) Apply daily to skin as directed CITALOPRAM HYDROBROMIDE 20 MG TABS (CITALOPRAM HYDROBROMIDE) take one tablet by mouth daily VITAMIN D 1000 UNIT  TABS (CHOLECALCIFEROL) 2,000 units a day MIRENA 20 MCG/24HR IUD (LEVONORGESTREL)  METFORMIN HCL 500 MG XR24H-TAB (METFORMIN HCL) 3 by mouth daily LISINOPRIL 2.5 MG TABS (LISINOPRIL) 1 by mouth daily LEVOTHYROXINE SODIUM 200 MCG TABS (LEVOTHYROXINE SODIUM) 1 by mouth daily ACCU-CHEK MULTICLIX LANCETS  MISC (LANCETS) check blood sugar twice daily Current Allergies: No known allergies

## 2010-06-22 NOTE — Letter (Signed)
Summary: [facility]   Imported By: Kassie Mends 06/17/2010 11:42:58  _____________________________________________________________________  External Attachment:    Type:   Image     Comment:   External Document

## 2010-07-07 ENCOUNTER — Telehealth: Payer: Self-pay | Admitting: Family Medicine

## 2010-07-07 NOTE — Letter (Signed)
Summary: [facility]   Imported By: Sherian Rein 06/27/2010 08:41:59  _____________________________________________________________________  External Attachment:    Type:   Image     Comment:   External Document

## 2010-07-12 NOTE — Progress Notes (Signed)
Summary: refill request for mometasone ointment  Phone Note Refill Request Message from:  Fax from Pharmacy  Refills Requested: Medication #1:  MOMETASONE FUROATE 0.1 % OINT Apply daily to skin as directed   Last Refilled: 02/14/2010 Faxed request from [facility].  Initial call taken by: Lowella Petties CMA, AAMA,  July 07, 2010 11:57 AM    Prescriptions: MOMETASONE FUROATE 0.1 % OINT (MOMETASONE FUROATE) Apply daily to skin as directed  #3 tubes x 3   Entered and Authorized by:   Hannah Beat MD   Signed by:   Hannah Beat MD on 07/07/2010   Method used:   Electronically to        [facility]* (retail)       [address]       [address] Shipping/mailing       [place], [place]  [address]       Ph: [phone]       Fax: [phone]   RxID:   [id number]

## 2010-07-14 ENCOUNTER — Encounter (INDEPENDENT_AMBULATORY_CARE_PROVIDER_SITE_OTHER): Payer: Self-pay | Admitting: *Deleted

## 2010-07-19 NOTE — Miscellaneous (Signed)
Summary: med list update- lancet device kit  Clinical Lists Changes  Medications: Added new medication of ACCU-CHEK MULTICLIX LANCET DEV  KIT (LANCETS MISC.) use as directed     Prior Medications: CALTRATE 600+D 600-400 MG-UNIT TABS (CALCIUM CARBONATE-VITAMIN D) daily MOMETASONE FUROATE 0.1 % OINT (MOMETASONE FUROATE) Apply daily to skin as directed CITALOPRAM HYDROBROMIDE 20 MG TABS (CITALOPRAM HYDROBROMIDE) take one tablet by mouth daily VITAMIN D 1000 UNIT  TABS (CHOLECALCIFEROL) 2,000 units a day MIRENA 20 MCG/24HR IUD (LEVONORGESTREL)  METFORMIN HCL 500 MG XR24H-TAB (METFORMIN HCL) 3 by mouth daily LISINOPRIL 2.5 MG TABS (LISINOPRIL) 1 by mouth daily LEVOTHYROXINE SODIUM 200 MCG TABS (LEVOTHYROXINE SODIUM) 1 by mouth daily ACCU-CHEK MULTICLIX LANCETS  MISC (LANCETS) check blood sugar twice daily TRUETEST TEST  STRP (GLUCOSE BLOOD) check blood sugar two times a day ACCU-CHEK MULTICLIX LANCET DEV  KIT (LANCETS MISC.) use as directed Current Allergies: No known allergies

## 2010-08-08 LAB — URINALYSIS, ROUTINE W REFLEX MICROSCOPIC
Glucose, UA: NEGATIVE mg/dL
Ketones, ur: NEGATIVE mg/dL
pH: 7.5 (ref 5.0–8.0)

## 2010-08-08 LAB — DIFFERENTIAL
Basophils Relative: 1 % (ref 0–1)
Eosinophils Absolute: 0.2 10*3/uL (ref 0.0–0.7)
Lymphs Abs: 2.2 10*3/uL (ref 0.7–4.0)
Monocytes Relative: 5 % (ref 3–12)
Neutro Abs: 7.5 10*3/uL (ref 1.7–7.7)
Neutrophils Relative %: 71 % (ref 43–77)

## 2010-08-08 LAB — COMPREHENSIVE METABOLIC PANEL
ALT: 22 U/L (ref 0–35)
AST: 24 U/L (ref 0–37)
Alkaline Phosphatase: 55 U/L (ref 39–117)
CO2: 25 mEq/L (ref 19–32)
GFR calc Af Amer: >60 mL/min (ref >60)
Glucose, Bld: 101 mg/dL — ABNORMAL HIGH (ref 70–99)
Potassium: 3.5 mEq/L (ref 3.5–5.1)
Sodium: 138 mEq/L (ref 135–145)
Total Bilirubin: 0.9 mg/dL (ref 0.3–1.2)

## 2010-08-08 LAB — CBC
HCT: 44.3 % (ref 36.0–46.0)
MCHC: 34.3 g/dL (ref 30.0–36.0)
Platelets: 355 10*3/uL (ref 150–400)

## 2010-08-08 LAB — PREGNANCY, URINE: Preg Test, Ur: NEGATIVE

## 2010-08-08 LAB — URINE MICROSCOPIC-ADD ON

## 2010-08-15 LAB — POCT I-STAT, CHEM 8
Chloride: 102 mEq/L (ref 96–112)
Glucose, Bld: 111 mg/dL — ABNORMAL HIGH (ref 70–99)
HCT: 44 % (ref 36.0–46.0)
Potassium: 4.1 mEq/L (ref 3.5–5.1)

## 2010-08-15 LAB — POCT CARDIAC MARKERS
CKMB, poc: <1 ng/mL — ABNORMAL LOW (ref 1.0–8.0)
Myoglobin, poc: 43.8 ng/mL (ref 12–200)
Troponin i, poc: <0.05 ng/mL (ref 0.00–0.09)

## 2010-08-15 LAB — CBC
Hemoglobin: 14.4 g/dL (ref 12.0–15.0)
Platelets: 340 10*3/uL (ref 150–400)
RDW: 13.2 % (ref 11.5–15.5)

## 2010-09-06 ENCOUNTER — Other Ambulatory Visit: Payer: Self-pay | Admitting: Family Medicine

## 2010-09-06 ENCOUNTER — Other Ambulatory Visit: Payer: Self-pay | Admitting: *Deleted

## 2010-09-06 MED ORDER — CITALOPRAM HYDROBROMIDE 20 MG PO TABS: 20.0000 mg | ORAL_TABLET | Freq: Every day | ORAL | Status: DC

## 2010-09-06 NOTE — Telephone Encounter (Signed)
Scripts sent to pharmacy 

## 2010-09-13 NOTE — Op Note (Signed)
NAMEDESERIE, DIRKS                 ACCOUNT NO.:  [id number]   MEDICAL RECORD NO.:  [id number]          PATIENT TYPE:  AMB   LOCATION:  ENDO                         FACILITY:  [facility]   PHYSICIAN:  Anselmo Rod, M.D.  DATE OF BIRTH:  Mar 12, 1981   DATE OF PROCEDURE:  01/15/2008  DATE OF DISCHARGE:                               OPERATIVE REPORT   PROCEDURE PERFORMED:  Esophagogastroduodenoscopy with small bowel  biopsies   ENDOSCOPIST:  Anselmo Rod, M.D.   INSTRUMENT USED:  Pentax video colonoscope.   INDICATIONS FOR PROCEDURE:  A 30 year old white female with a history of  abdominal pain and diarrhea undergoing an EGD.  Small bowel biopsies are  planned to rule out celiac sprue.  Preprocedure preparation and informed  consent was procured from the patient.  The patient fasted for 8 hours  and brought to the procedure.  Risks and benefits of the procedure were  discussed with the patient in detail, preprocedure physical.  The  patient had stable vital signs.  Neck supple.  Chest is clear to  auscultation.  S1 and S2 regular.  Abdomen soft with normal bowel  sounds.   DESCRIPTION OF THE PROCEDURE:  The patient was placed in the left  lateral decubitus position, sedated with 100 mcg of Fentanyl and 10 mg  of Versed given intravenously in slow incremental doses. Once the  patient was adequately sedated, maintained on low flow oxygen,  continuous cardiac monitoring, the Pentax video panendoscope was  advanced with a mouthpiece over the tongue into the esophagus and under  direct vision the entire esophagus was widely patent with no evidence of  ring, stricture, mass, esophagitis, or Barrett's mucosa. The scope was  then advanced into the stomach. The entire gastric mucosa and the  proximal small bowel appeared normal.  Small bowel biopsies were done to  rule out sprue.  No ulcers, erosions, masses or polyps were identified.  The patient tolerated the procedure well without  immediate complication.   IMPRESSION:  1. Normal EGD, small bowel biopsies done to rule out sprue.  2. Proceed with a colonoscopy at this time.  3. Further recommendation to be made thereafter.      Anselmo Rod, M.D.  Electronically Signed     JNM/MEDQ  D:  01/15/2008  T:  01/16/2008  Job:  [id number]   cc:   Veverly Fells. Altheimer, M.D.  Fax: [phone]

## 2010-09-13 NOTE — Op Note (Signed)
April Cortez, April Cortez                 ACCOUNT NO.:  [id number]   MEDICAL RECORD NO.:  [id number]          PATIENT TYPE:  AMB   LOCATION:  ENDO                         FACILITY:  [facility]   PHYSICIAN:  Anselmo Rod, M.D.  DATE OF BIRTH:  1981/01/12   DATE OF PROCEDURE:  01/15/2008  DATE OF DISCHARGE:                               OPERATIVE REPORT   PROCEDURE PERFORMED:  Colonoscopy with snare polypectomy x2 and multiple  random biopsies.   /SURGEON/>  Anselmo Rod, MD   INSTRUMENT USED:  Pentax video colonoscope.   INDICATIONS FOR PROCEDURE:  A 30 year old white female with a history of  diarrhea and abdominal pain, rule out IBD.   PREPROCEDURE PREPARATION:  Informed consent was procured from the  patient. The patient fasted for 8 hours prior to the procedure after  being prepped with a bottle of magnesium citrate and gallon of NuLYTELY  the night prior to the procedure.  Risks and benefits of the procedure  including 10% miss rate of cancer and polyp were discussed with the  patient as well.   PREPROCEDURE PHYSICAL:  VITAL SIGNS:  Stable.  NECK:  Supple.  CHEST:  Clear to auscultation.  HEART:  S1 and S2, regular.  ABDOMEN:  Soft with normal bowel sounds.   DESCRIPTION OF PROCEDURE:  The patient was placed in left lateral  decubitus position and sedated with additional 50 mcg of Fentanyl and 3  mg of Versed given intravenously in slow incremental doses.  Once the  patient is adequately sedated and maintained on low-flow oxygen,  continuous cardiac monitory. The Pentax video colonoscope was advanced  from the rectum to the cecum.  The appendiceal orifice and the ileocecal  valve actually visualized and the photographs of the terminal ileum  appeared healthy without lesions. Random colon biopsies were done  throughout microscopic with lymphocytic colitis.  Two small sessile  polyps were seen in the left colon, these were removed by hot snare  (200/20).  Retroflexion of the  rectum revealed no abnormalities.  The  patient tolerated the procedure well without immediate complications.   IMPRESSION:  1. Two small sessile polyps removed by hot snare from the left colon.  2. Random colon biopsy done to rule out collagenous versus microscopic      versus lymphocytic colitis.  3. Normal terminal ileum.   RECOMMENDATIONS:  1. Await pathology results.  2. To avoid all nonsteroidals including Aspirin and Motrin etc.  3. Further recommendation was made once the pathology results have      been procured.  4. Outpatient followup in the next 2 weeks.      Anselmo Rod, M.D.  Electronically Signed     JNM/MEDQ  D:  01/15/2008  T:  01/16/2008  Job:  [id number]   cc:   Veverly Fells. Altheimer, M.D.  Fax: [phone]

## 2010-09-13 NOTE — H&P (Signed)
NAMEVENUS, GILLES                 ACCOUNT NO.:  [id number]   MEDICAL RECORD NO.:  [id number]          PATIENT TYPE:  INP   LOCATION:  5123                         FACILITY:  [facility]   PHYSICIAN:  Sharlet Salina T. Hoxworth, M.D.DATE OF BIRTH:  May 14, 1980   DATE OF ADMISSION:  09/29/2008  DATE OF DISCHARGE:                              HISTORY & PHYSICAL   CHIEF COMPLAINT:  Abdominal pain.   HISTORY OF PRESENT ILLNESS:  April Cortez is a 30 year old female patient,  developed abdominal pain between 4:30 and 5 o'clock yesterday,  progressive in nature.  Because of the severity of the pain, she  presented to the ER.  She was found to have leukocytosis.  A CT scan was  ordered with oral and IV contrast that was consistent with early  appendicitis.  The patient reports the pain began in the periumbilical  region and lower pelvis and she thought it was more like menstrual  cramps and she actually did start her menses today, but with increasing  pain and pain becoming more focal to the right lower quadrant.  She  became concerned and, as noted, presented to the ER.   REVIEW OF SYSTEMS:  Since the onset of the pain, the patient has had  severe nausea.  She remains nauseous.  No emesis.  No fevers.  No  chills.  No diarrhea and again she has started her period today.   PAST MEDICAL HISTORY:  1. Hypothyroidism.  2. Depression.   PAST SURGICAL HISTORY:  Laparoscopic cholecystectomy by Dr. Megan Mans in  2007.   FAMILY HISTORY:  Noncontributory.   SOCIAL HISTORY:  No alcohol.  No tobacco.  No illegal drugs.  She works  as a Buyer, retail for [employer].   DRUG ALLERGIES:  NKDA.   CURRENT MEDICATIONS:  1. Synthroid 224 mcg daily.  2. Over-the-counter multiple vitamins.  3. Celexa daily.   PHYSICAL EXAMINATION:  GENERAL:  Pleasant female patient, mildly toxic  appearing, reporting severe abdominal pain focalized to the right lower  quadrant.  PSYCH:  The patient is alert and  oriented x3.  Affect appropriate to  current situation.  NEURO:  Cranial nerves II-XII are grossly intact.  She is moving all  extremities x4 without any focal neurological deficits.  HEENT:  Sclerae are mildly injected.  Ears, nose, and throat:  Ears are  symmetrical.  No otorrhea.  Nose is midline.  No rhinorrhea.  Oral  mucous membranes are pink and moist.  CHEST:  Bilateral lung sounds clear to auscultation anteriorly.  Respiratory effort is nonlabored.  She is on room air.  CARDIOVASCULAR:  Heart sounds S1, S2.  No obvious rubs, murmurs,  thrills, or gallops.  No JVD.  No peripheral edema.  Pulses regular and  not tachycardic.  She has IV fluids infusing at 50 an hour.  ABDOMEN:  Obese, but soft.  Previous trocar sites from laparoscopic  procedure are intact without evidence of herniation.  She is tender in  the mid abdomen, but more so in the right lower quadrant with guarding.  Minimal rebounding of her McBurney  point.  Bowel sounds are present, but  diminished.  EXTREMITIES:  Symmetrical in appearance without cyanosis or clubbing.  SKIN:  The patient has evidence of redness on the anterior forehead  consistent with recent sunburn.   LABORATORY DATA AND X-RAY:  White count 10,600, hemoglobin 15.2,  platelets 255,000, neutrophils 71%.  Sodium 138, potassium 3.5, CO2 25,  glucose 101, BUN 6, creatinine 0.57.  CT again shows increased size and  diameter of appendix with some periappendiceal fat stranding consistent  with early appendicitis.   IMPRESSION:  Early appendicitis.   PLAN:  1. Admit the patient to general surgical floor.  2. Operative intervention tonight.  Risks and benefits of the      procedure have been discussed with the patient, plan a laparoscopic      approach unless unable to achieve, then would do an open procedure.  3. Empiric Zosyn for enteric pathogen coverage.  4. Treat symptoms with IV narcotics and antiemetic medications.  5. IV fluids at 150 an hour  to rehydrate.      April Cortez, N.P.      Lorne Skeens. Hoxworth, M.D.  Electronically Signed    ALE/MEDQ  D:  09/29/2008  T:  09/30/2008  Job:  [id number]

## 2010-09-13 NOTE — Assessment & Plan Note (Signed)
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  The patient  took Phen-Fen in the past for weight loss when she was 58 to 30 years  old.  She did stop it and had an echo afterwards which apparently was  within normal limits.  She started taking phentermine again last  Wednesday.  She took it Wednesday, Thursday, and Friday but developed  chest pressure, palpitations, and racing heart rate while taking the  phentermine.  The chest pressure would come and go, nothing in  particular would trigger it, and it began the second day that she was  taking phentermine.  She did go to the emergency department on Saturday,  had her blood pressure taken.  Blood pressure 140/90 which she says is  high for her.  She did have cardiac enzymes done and she had point of  care troponin that was negative x2.  She was discharged home for  followup with Cardiology.  She has not taken phentermine since that time  and she has had no further heart palpitations or racing and no further  chest pain or tightness.  In general, prior to this, the patient had  been doing well.  She has not had any episodes of chest pain with  exertion.  She does not get dyspnea on exertion.  She has good exercise  tolerance.  She does smoke about a pack a day though she wants to quit.  Her husband smokes also and they have set a quit date.  She does not  have family history of premature coronary disease.  She does use oral  contraceptive pills.  Also, in the emergency department, the patient had  a chest x-ray done that showed no obvious  cardiopulmonary disease.   PAST MEDICAL HISTORY:  1. Obesity.  2. Hypothyroidism.  3. History of abdominal pain/diarrhea.  4. History of use of Phen-Fen in the past, apparently that was over 10      years ago.  The patient says that at the time, she did have an      echocardiogram done, and she was told that it was normal.   MEDICATIONS:  Oral contraceptive pills and Synthroid.   SOCIAL HISTORY:  The patient is married.  She has 2 children.  She is a  respiratory therapist at [employer].  She smokes about a pack a day.   FAMILY HISTORY:  There is no family history of premature coronary  disease.  The patient's mother has hypertension.   REVIEW OF SYSTEMS:  Negative except as noted in history of present  illness.   LABORATORY DATA:  EKG was reviewed today.  She has normal sinus rhythm,  has a  normal EKG.  Labs from January 2010, cardiac enzymes negative x2.  At the time of her ER trip, hematocrit 42.7 and creatinine 0.7.   PHYSICAL EXAMINATION:  VITAL SIGNS:  Blood pressure is 138/84, heart  rate is 92 and regular, and weight is 231 pounds.  GENERAL:  This is an obese female in no apparent distress.  NEUROLOGIC:  Alert and oriented x3.  Normal affect.  HEENT:  Normal exam.  NECK:  There is no thyromegaly or thyroid nodule.  There is no JVD.  LUNGS:  Clear to auscultation bilaterally with normal respiratory  effort.  CARDIOVASCULAR:  Heart regular.  S1 and S2.  No S3.  No S4.  There is no  murmur.  There is no carotid bruit.  There is no peripheral edema.  There are 2+ posterior tibial pulses bilaterally.  ABDOMEN:  Soft, obese, and nontender.  No hepatosplenomegaly.  Normal  bowel sounds.  EXTREMITIES:  No clubbing or cyanosis.  SKIN:  Normal exam.  MUSCULOSKELETAL:  Normal exam.   ASSESSMENT AND PLAN:  This is a 30 year old who presents with chest pain  and palpitations after using phentermine.   1. Chest pain/palpitations.  I think that this is probably related to       the patient's use of phentermine.  Phentermine is known to cause      tachycardia and can also raise the blood pressure.  I think that      this probably is the source of her symptoms.  She has no family      history of premature coronary disease. I doubt that she has      coronary atherosclerosis.  I do think it is reasonable for [place] to      get a Holter monitor given the palpitations to make sure she does      not have any malignant arrhythmias.  We will also check a TSH.      Additionally, given the fact that she has chest pain in the setting      of smoking and use of oral contraceptive pills, we will check D-      dimer to make sure that there is no evidence for her having a      pulmonary embolus though I doubt this as her chest pain has now      resolved.  I think she is low risk for coronary ischemia, so I do      not think that we need to do a stress test at this time.  2. We will get an echocardiogram.  The patient does have a history of      Phen-Fen use in the past.  3. We will check the patient's lipids.  4. Smoking.  I did talk to the patient about smoking cessation.  We      will give her a prescription for Chantix.  5. The patient was referred to the primary care physician at [facility].  I will set her up to see Dr. Patsy Lager.     Marca Ancona, MD  Electronically Signed    DM/MedQ  DD: 05/18/2008  DT: 05/19/2008  Job #: [id number]   cc:   Juleen China, MD

## 2010-09-13 NOTE — Op Note (Signed)
NAMEMASSA, PE                 ACCOUNT NO.:  [id number]   MEDICAL RECORD NO.:  [id number]          PATIENT TYPE:  INP   LOCATION:  5123                         FACILITY:  [facility]   PHYSICIAN:  Sharlet Salina T. Hoxworth, M.D.DATE OF BIRTH:  17-Nov-1980   DATE OF PROCEDURE:  09/29/2008  DATE OF DISCHARGE:                               OPERATIVE REPORT   PREOPERATIVE DIAGNOSIS:  Acute appendicitis.   POSTOPERATIVE DIAGNOSIS:  Acute appendicitis.   SURGICAL PROCEDURE:  Laparoscopic appendectomy.   SURGEON:  Lorne Skeens. Hoxworth, MD   ANESTHESIA:  General.   BRIEF HISTORY:  April Cortez is a 30 year old female, who presents with  24 hours of typical diffuse and then localized right lower quadrant  abdominal pain and nausea.  She has localized tenderness in the right  lower quadrant.  A CT scan that showed evidence of acute appendicitis  without perforation or abscess.  I have recommended proceeding with  laparoscopic appendectomy.  The nature of procedure, indications, risks  of bleeding, infection, and possible need for open procedure were  discussed and understood.  She is now brought to the operating room for  this procedure.   DESCRIPTION OF OPERATION:  The patient was brought to the operating room  and placed in the supine position on the operating table and general  endotracheal anesthesia was induced.  She received preoperative IV  antibiotics.  The abdomen was widely sterilely prepped and draped.  Correct patient and procedure were verified.  Local anesthesia was used  to infiltrate the trocar sites.  A 1-cm incision was made just below the  umbilicus and dissection was carried down to the midline of fascia,  which was sharply incised from 1 cm, and the peritoneum entered under  direct vision.  Through a mattress suture of 0 Vicryl, the Hasson trocar  was placed and pneumoperitoneum established.  Under direct vision, a 5-  mm trocar was placed in the right upper quadrant and a  12-mm trocar in  the left lower quadrant.  The appendix was lying just lateral to the  inferior portion of the cecum.  It was elevated and was acutely inflamed  with exudate.  The base of the appendix was obscured by the terminal  ileum, which was adherent over to the lateral pelvic sidewall and this  was mobilized dividing lateral peritoneal attachments and mobilizing the  terminal ileum and then mobilizing the cecum until the appendix was much  better exposed.  The mesoappendix was then sequentially divided with a  harmonic scalpel.  There was a lot of fatty tissue around the base of  the appendix and the cecum, which was carefully dissected, and the base  of the appendix was mobilized and freed until it was completely cleared  and at the tip of the cecum where it was not inflamed.  The appendix was  then divided across the tip of the cecum with a single firing of a blue  load 45-mm stapler.  The staple line was intact without bleeding.  The  right lower quadrant and pelvis were thoroughly irrigated and hemostasis  assured.  The  appendix was placed in an EndoCatch bag and brought out through the  umbilical incision.  This was closed with a purse-string suture.  Skin  was closed with subcutaneous Monocryl and Dermabond.  Sponge, needle,  and instrument counts were correct.  The patient was taken to recovery  in good condition.      Lorne Skeens. Hoxworth, M.D.  Electronically Signed     BTH/MEDQ  D:  09/29/2008  T:  09/30/2008  Job:  [id number]

## 2010-09-16 NOTE — H&P (Signed)
[facility]  Patient:    April Cortez, April Cortez                           MRN: [id number] Adm. Date:  06/21/00 Attending:  Erie Noe P. Pennie Rushing, M.D. Dictator:   Nigel Bridgeman, C.N.M.                         History and Physical  HISTORY:                      April Cortez is a 30 year old gravida 1, para 0 at 97 4/7 weeks who was admitted from the office secondary to early labor. Cervix was 4 cm in the office, 90%, vertex at a -1 station with a bulging bag of water and uterine contractions every two minutes.  Pregnancy has been remarkable for hyperthyroidism, conception on OCPs, abnormal AFP with elevated open neural tube defect risk.  PRENATAL LABORATORIES:        Blood type A+.  Rh antibody negative.  VDRL nonreactive.  Rubella titer positive.  Hepatitis B surface antigen negative. HIV nonreactive.  GC and chlamydia cultures negative.  Pap normal.  Urine culture negative.  Glucose challenge normal.  AFP showed elevated open neural tube defect risk with normal ultrasound.  Group B strep culture was negative at 36 weeks.  TSHs were followed at her primary M.D. office.  HISTORY OF PRESENT ILLNESS:   Patient entered care at approximately 10 weeks. She had an ultrasound on her new OB visit for viability.  There was a two week discrepancy in her dates.  She had an [facility] recalculated of June 16, 2000 at that 12 week ultrasound.  She had AFP done at 16 weeks which showed elevated open neural tube defect risk.  She declined amniocentesis.  She had a normal ultrasound at 18 weeks.  She had consultation with ______.  She had no problems with her thyroid disease during her pregnancy.  She did have sporadic musculoskeletal issues.  Her T3 was 240 at 29 weeks and she was kept on PTU daily at her normal usual dose.  She had some general musculoskeletal issues as her pregnancy progressed late in the third trimester.  She had sporadic elevations of her blood pressure at her last  three visits, although this did always resolve with position change.  She had no other problems during her pregnancy.  PAST OBSTETRICAL HISTORY:     Patient is a prima gravida.  PAST MEDICAL HISTORY:         She was on Ortho Tri-Cyclen at the time of conception.  She has occasional yeast infections.  Reports usual childhood illnesses.  She was diagnosed with Graves disease in July 2000.  She is followed by Dr. Mosetta Pigeon.  She has occasional irritable bowel syndrome symptoms.  She has been a smoker.  She broke her collar bone as a child.  Also broke her arm and her leg in the past.  She had her tonsils removed at age 57.  FAMILY HISTORY:               Her maternal grandmother is adult-onset diabetic.  Paternal grandfather is adult-onset diabetic.  Maternal grandmother had breast cancer and had a lumpectomy.  There is breast cancer history on both sides of the family.  GENETIC HISTORY:              Remarkable for  the patients maternal uncle having Down syndrome.  SOCIAL HISTORY:               Patient is single.  The father of the baby is questionably supportive.  Patient is in her third year of college.  She is employed as a Lawyer 1.  She is Caucasian of the [employer].  She has been followed by the certified midwife service at [facility].  She denies any drug use during this pregnancy.  She was a smoker up until the time of her positive urine pregnancy test.  She was also on amoxicillin and PTU to 4 units during her pregnancy.  ALLERGIES:                    Sensitive to CORTISONE which causes flu like symptoms.  PHYSICAL EXAMINATION  VITAL SIGNS:                  Stable.  Patient is afebrile.  HEENT:                        Within normal limits.  LUNGS:                        Bilateral breath sounds are clear.  HEART:                        Regular without murmur.  BREASTS:                      Soft and nontender.  ABDOMEN:                      Fundal height is  approximately 39 cm.  Estimated fetal weight 6.5-7 pounds.  Uterine contractions are every two to three minutes of mild to moderate quality.  PELVIC:                       Cervix:  4 cm, 90%, vertex at a -1 station with bulging bag of water.  Fetal heart rate is in the 140s-150s by Doppler with no audible decelerations.  There is a small amount of bloody show noted.  EXTREMITIES:                  Deep tendon reflexes are 2+ without clonus. There is trace edema noted.  IMPRESSION:                   1. Intrauterine pregnancy at 40 4/7 weeks.                               2. Early labor.                               3. Negative group B strep.                               4. Hyperthyroidism.  PLAN:                         1. Admit to birthing suite for consult with Dr.  Dierdre Forth as attending physician.                               2. Routine certified nurse midwife orders.                               3. Anticipate normal spontaneous vaginal birth. DD:  06/21/00 TD:  06/21/00 Job: [id number]

## 2010-09-16 NOTE — Op Note (Signed)
April Cortez, April Cortez                 ACCOUNT NO.:  [id number]   MEDICAL RECORD NO.:  [id number]          PATIENT TYPE:  OIB   LOCATION:  5729                         FACILITY:  [facility]   PHYSICIAN:  Cherylynn Ridges, M.D.    DATE OF BIRTH:  Apr 02, 1981   DATE OF PROCEDURE:  08/03/2005  DATE OF DISCHARGE:  08/03/2005                                 OPERATIVE REPORT   PREOPERATIVE DIAGNOSIS:  Symptomatic biliary dyskinesia.   POSTOPERATIVE DIAGNOSIS:  Symptomatic biliary dyskinesia.   PROCEDURE:  Laparoscopic cholecystectomy with cholangiogram.   SURGEON:  Cherylynn Ridges, M.D.   ASSISTANT:  Rose Phi. Maple Hudson, M.D.   ANESTHESIA:  General endotracheal.   ESTIMATED BLOOD LOSS:  Less than 20 mL.   COMPLICATIONS:  None.   CONDITION:  Stable.   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   FINDINGS:  The patient had some adhesions to the infundibulum of the  gallbladder.  Cholangiogram was normal with good flow into the duodenum, no  evidence of intraductal filling defects, no dilatation, and good proximal  flow.   OPERATION:  The patient was taken to the operating room and placed on the  table in supine position.  After an adequate general anesthetic was  administered, she was prepped and draped in usual sterile manner exposing  the midline of the abdomen.  I usually use a supraumbilical incision for  this operation however, the patient had a supraumbilical piercing,  therefore, we made a longitudinal inferior infraumbilical incision down to  the midline fascia.  It was the fascia that was grasped with Kocher clamps  and incision made between using the #15 blade.  We bluntly dissected down  into the peritoneum with Kelly clamp and then placed a pursestring suture of  0 Vicryl in the fascial opening.  This  secured in the [facility] cannula which  was subsequently passed and then carbon dioxide gas was insufflated through  the [facility] cannula into the peritoneal cavity up to maximal pressure of 15  mmHg.  Once this was done, two right costal margin 5-mm cannulas and a  subxiphoid 11/12 cannula were passed under direct vision into the peritoneal  cavity.  The patient was placed reverse Trendelenburg, the left side was  tilted down, and the dissection begun.   The dome of the gallbladder was decompressed and could be grasped with a  ratcheted grasper through the lateral most cannula and retracted towards the  anterior abdominal wall and the right upper quadrant.  We saw the adhesions  to the infundibulum which were taken down using a Maryland clamp.  We then  used the blunt nose dissector to dissect out the peritoneum over the  triangle of Calot and hepatoduodenal triangle.  This exposed both the cystic  duct and cystic artery.  A clip was placed along the gallbladder side of the  cystic duct and subsequently cholecystodochotomy made using laparoscopic  scissors.  It was through this cholecystodochotomy that a Cook catheter was  passed through the anterior abdominal and then through the  cholecystodochotomy in order to perform a cholangiogram.  The cholangiogram  showed, as mentioned previously, good proximal filling, no filling defects,  no dilatation, and good flow into the duodenum.   Once the cholangiogram was completed, we removed the catheter, removed the  clip, and triply clipped the distal cystic duct and transected it.  We then  dissected out the cystic artery, placed proximal clips and distal clips x2  on both sides and transected it.  We then dissected out the gallbladder from  the gallbladder bed with minimal difficulty without entrance into the  gallbladder.  Once it was completely removed, we able to remove it from the  infraumbilical incision site with minimal difficulty.  The  pursestring  suture which had been placed to hold the Hasson cannula was tied down and  then we inspected the gallbladder bed with minimal difficulty.  We irrigated  with small amount of saline, aspirated gas and fluid from above the liver,  and removed all cannulae.   0.25% Marcaine with epi was used to inject at the skin sites at all four  incisions.  The skin was closed using running subcuticular stitch of 4-0  Vicryl.  All needle counts, sponge counts, and instrument counts were  correct.  Sterile dressings were applied.      Cherylynn Ridges, M.D.  Electronically Signed     JOW/MEDQ  D:  08/03/2005  T:  08/03/2005  Job:  [id number]   cc:   Anselmo Rod, M.D.  Fax: [phone]

## 2010-09-16 NOTE — Discharge Summary (Signed)
April Cortez, April Cortez                 ACCOUNT NO.:  [id number]   MEDICAL RECORD NO.:  [id number]          PATIENT TYPE:  OBV   LOCATION:  5123                         FACILITY:  [facility]   PHYSICIAN:  Sharlet Salina T. Hoxworth, M.D.DATE OF BIRTH:  [birth year]   DATE OF ADMISSION:  09/29/2008  DATE OF DISCHARGE:  09/30/2008                               DISCHARGE SUMMARY   CHIEF COMPLAINT/REASON FOR ADMISSION:  Ms. Mayford Knife is a 30 year old  female patient, who developed progressive abdominal pain in the  periumbilical, then in the lower pelvic region.  She initially thought  she was having symptoms consistent with menstrual cramps and this was  consistent with her physical exam, given the fact she did start her  menses on the day admission, but the pain became more severe and more  focal in the right lower quadrant.  Thus, she presented to the ER.  She  was found to have leukocytosis and her abdominal exam concerning for  acute cholecystitis.  Therefore, a CT scan was ordered and obtained.  It  demonstrated early appendicitis.  The patient was admitted with the  following diagnosis:  Acute appendicitis.   HOSPITAL COURSE:  The patient was admitted, placed on empiric antibiotic  therapy with Zosyn, and she was subsequently taken to the OR that  afternoon where she underwent laparoscopic appendectomy for acute  nonperforated appendicitis by Dr. Johna Sheriff.   On postop day #1, the patient was on the surgical floor.  She was  afebrile.  Her vital signs were stable.  She was tolerating solid diet.  Her abdomen was soft and nontender, and she was deemed appropriate for  discharge to home.   FINAL DISCHARGE DIAGNOSIS:  Abdominal pain secondary to acute  nonperforated appendicitis status post laparoscopic appendectomy.   DISCHARGE MEDICATIONS:  1. Synthroid 224 mcg daily.  2. Vitamin D 2000 mg daily.  3. Fish oil daily.  4. Celexa 20 mg daily.   New medications include over-the-counter Tylenol or  Advil as needed for  pain.   DIET:  No restrictions.  Return to work on October 07, 2008.   ACTIVITY:  Increase activity slowly.  May walk up steps.  May shower.   FOLLOWUP APPOINTMENTS:  She needs to see Dr. Johna Sheriff in 1-2 weeks.  She  needs to call for an appointment.      Allison L. Rennis Harding, N.P.      Lorne Skeens. Hoxworth, M.D.  Electronically Signed    ALE/MEDQ  D:  10/27/2008  T:  10/28/2008  Job:  [id number]

## 2010-09-28 ENCOUNTER — Encounter: Payer: Self-pay | Admitting: Family Medicine

## 2010-10-03 ENCOUNTER — Encounter: Payer: Self-pay | Admitting: Family Medicine

## 2010-10-03 ENCOUNTER — Ambulatory Visit (INDEPENDENT_AMBULATORY_CARE_PROVIDER_SITE_OTHER): Payer: 59 | Admitting: Family Medicine

## 2010-10-03 DIAGNOSIS — E119 Type 2 diabetes mellitus without complications: Secondary | ICD-10-CM

## 2010-10-03 DIAGNOSIS — E785 Hyperlipidemia, unspecified: Secondary | ICD-10-CM

## 2010-10-03 DIAGNOSIS — E039 Hypothyroidism, unspecified: Secondary | ICD-10-CM

## 2010-10-03 LAB — LIPID PANEL
LDL Cholesterol: 73 mg/dL (ref 0–99)
VLDL: 16.8 mg/dL (ref 0.0–40.0)

## 2010-10-03 LAB — BASIC METABOLIC PANEL
BUN: 14 mg/dL (ref 6–23)
Chloride: 99 mEq/L (ref 96–112)
Glucose, Bld: 123 mg/dL — ABNORMAL HIGH (ref 70–99)
Potassium: 4 mEq/L (ref 3.5–5.1)

## 2010-10-03 LAB — HEMOGLOBIN A1C: Hgb A1c MFr Bld: 7.2 % — ABNORMAL HIGH (ref 4.6–6.5)

## 2010-10-03 LAB — TSH: TSH: 0.94 u[IU]/mL (ref 0.35–5.50)

## 2010-10-03 NOTE — Progress Notes (Signed)
30 year old female:  DM:  50 pound weight loss Eye Exam: 06/2010 Foot exam - not recently Labs Medlink 6.9 a1c  Diabetes Mellitus: Tolerating Medications: y Compliance with diet: +/- Exercise: +/- Hypoglycemia: none No nausea, vomitting, blurred vision, polyuria.  Lab Results  Component Value Date   HGBA1C 7.4* 05/18/2010      Chemistry      Component Value Date/Time   NA 137 05/26/2009 1014   K 3.9 05/26/2009 1014   CL 101 05/26/2009 1014   CO2 28 05/26/2009 1014   BUN 11 05/26/2009 1014   CREATININE 0.6 05/26/2009 1014      Component Value Date/Time   CALCIUM 9.4 05/26/2009 1014   ALKPHOS 55 09/29/2008 1131   AST 24 09/29/2008 1131   ALT 22 09/29/2008 1131   BILITOT 0.9 09/29/2008 1131      Check labs  Thyroid: No symptoms. Labs reviewed. Denies cold / heat intolerance, dry skin, hair loss. No goiter.  Lab Results  Component Value Date   TSH 1.93 05/18/2010    Pap upcoming with Dr. Renaldo Fiddler.  Patient Active Problem List  Diagnoses  . HYPOTHYROIDISM  . DIABETES MELLITUS, TYPE II  . HYPERLIPIDEMIA  . ANXIETY  . URI  . ALLERGIC RHINITIS  . ECZEMA  . BACK PAIN, LUMBAR   Past Medical History  Diagnosis Date  . Diabetes mellitus   . Hypothyroid   . Anxiety   . Allergic rhinitis due to pollen   . Hyperlipidemia   . Obesity   . IBS (irritable bowel syndrome)    Past Surgical History  Procedure Date  . Cholecystectomy 2007  . Tonsillectomy     30 YRS OLD   History  Substance Use Topics  . Smoking status: Current Everyday Smoker  . Smokeless tobacco: Not on file  . Alcohol Use: No   Family History  Problem Relation Age of Onset  . Hypertension Mother    No Known Allergies Current Outpatient Prescriptions on File Prior to Visit  Medication Sig Dispense Refill  . Calcium Carbonate-Vit D-Min (CALTRATE 600+D PLUS) 600-400 MG-UNIT per tablet Chew 1 tablet by mouth daily.        . cholecalciferol (VITAMIN D) 1000 UNITS tablet Take 1,000 Units by mouth daily.         . citalopram (CELEXA) 20 MG tablet Take 1 tablet (20 mg total) by mouth daily.  90 tablet  3  . glucose blood (TRUETEST TEST) test strip 1 each by Other route 2 (two) times daily. Use as instructed       . Lancets (ACCU-CHEK MULTICLIX) lancets 1 each by Other route 2 (two) times daily. Use as instructed       . levonorgestrel (MIRENA) 20 MCG/24HR IUD 1 each by Intrauterine route once.        Marland Kitchen levothyroxine (SYNTHROID, LEVOTHROID) 200 MCG tablet Take 200 mcg by mouth daily.        Marland Kitchen lisinopril (PRINIVIL,ZESTRIL) 2.5 MG tablet Take 2.5 mg by mouth daily.        . metFORMIN (GLUCOPHAGE-XR) 500 MG 24 hr tablet TAKE 3 TABLETS BY MOUTH ONCE DAILY  270 tablet  3  . mometasone (ELOCON) 0.1 % ointment Apply 1 application topically daily.         ROS: GEN: No acute illnesses, no fevers, chills. GI: No n/v/d, eating normally Pulm: No SOB Interactive and getting along well at home.  Otherwise, ROS is as per the HPI.   Physical Exam  Blood pressure  100/68, pulse 85, temperature 98.4 F (36.9 C), temperature source Oral, height 5\' 4"  (1.626 m), weight 182 lb (82.555 kg), SpO2 97.00%.  GEN: WDWN, NAD, Non-toxic, A & O x 3 HEENT: Atraumatic, Normocephalic. Neck supple. No masses, No LAD. Ears and Nose: No external deformity. CV: RRR, No M/G/R. No JVD. No thrill. No extra heart sounds. PULM: CTA B, no wheezes, crackles, rhonchi. No retractions. No resp. distress. No accessory muscle use. EXTR: No c/c/e NEURO Normal gait.  PSYCH: Normally interactive. Conversant. Not depressed or anxious appearing.  Calm demeanor.   Diabetic foot exam: Normal inspection No skin breakdown No calluses  Normal DP pulses Normal sensation to light tough and monofilament Nails normal  A/P: DM: check a1c, bmp, flp: adjust meds if needed 2. Hypothyroid: check TSH, adjust if needed

## 2010-11-18 ENCOUNTER — Ambulatory Visit (INDEPENDENT_AMBULATORY_CARE_PROVIDER_SITE_OTHER): Payer: 59 | Admitting: Internal Medicine

## 2010-11-18 ENCOUNTER — Encounter: Payer: Self-pay | Admitting: Internal Medicine

## 2010-11-18 VITALS — BP 120/70 | HR 87 | Temp 97.8°F | Wt 183.0 lb

## 2010-11-18 DIAGNOSIS — J45909 Unspecified asthma, uncomplicated: Secondary | ICD-10-CM | POA: Insufficient documentation

## 2010-11-18 DIAGNOSIS — J45901 Unspecified asthma with (acute) exacerbation: Secondary | ICD-10-CM

## 2010-11-18 DIAGNOSIS — J019 Acute sinusitis, unspecified: Secondary | ICD-10-CM

## 2010-11-18 MED ORDER — AMOXICILLIN 500 MG PO TABS: 1000.0000 mg | ORAL_TABLET | Freq: Two times a day (BID) | ORAL | Status: AC

## 2010-11-18 MED ORDER — PREDNISONE 20 MG PO TABS: 40.0000 mg | ORAL_TABLET | Freq: Every day | ORAL | Status: AC

## 2010-11-18 NOTE — Progress Notes (Signed)
Subjective:    Patient ID: April Cortez, female    DOB: 1980-09-24, 30 y.o.   MRN: [id number]  HPI Has had a cold since last week Into chest early Still with bad congestion Intermittent ear pain and pressure  Cough with green sputum (thick)--lots in night but daytime also No fever Has noticed some DOE walking stairs  Slight sore throat Has PND and nasal drainage  Has tried mucinex and antihistamine at first Also robitussin cold and cough---none of these help for very long  Has had sinus headaches throughout the week  Current Outpatient Prescriptions on File Prior to Visit  Medication Sig Dispense Refill  . Calcium Carbonate-Vit D-Min (CALTRATE 600+D PLUS) 600-400 MG-UNIT per tablet Chew 1 tablet by mouth daily.        . cholecalciferol (VITAMIN D) 1000 UNITS tablet Take 1,000 Units by mouth daily.        . citalopram (CELEXA) 20 MG tablet Take 1 tablet (20 mg total) by mouth daily.  90 tablet  3  . glucose blood (TRUETEST TEST) test strip 1 each by Other route 2 (two) times daily. Use as instructed       . Lancets (ACCU-CHEK MULTICLIX) lancets 1 each by Other route 2 (two) times daily. Use as instructed       . levonorgestrel (MIRENA) 20 MCG/24HR IUD 1 each by Intrauterine route once.        Marland Kitchen levothyroxine (SYNTHROID, LEVOTHROID) 200 MCG tablet Take 200 mcg by mouth daily.        Marland Kitchen lisinopril (PRINIVIL,ZESTRIL) 2.5 MG tablet Take 2.5 mg by mouth daily.        . metFORMIN (GLUCOPHAGE-XR) 500 MG 24 hr tablet TAKE 3 TABLETS BY MOUTH ONCE DAILY  270 tablet  3  . mometasone (ELOCON) 0.1 % ointment Apply 1 application topically daily.          No Known Allergies  Past Medical History  Diagnosis Date  . Diabetes mellitus   . Hypothyroid   . Anxiety   . Allergic rhinitis due to pollen   . Hyperlipidemia   . Obesity   . IBS (irritable bowel syndrome)     Past Surgical History  Procedure Date  . Cholecystectomy 2007  . Tonsillectomy     30 YRS OLD    Family History    Problem Relation Age of Onset  . Hypertension Mother     History   Social History  . Marital Status: Married    Spouse Name: N/A    Number of Children: N/A  . Years of Education: N/A   Occupational History  . RESPIRATORY THERAPIST    Social History Main Topics  . Smoking status: Current Everyday Smoker  . Smokeless tobacco: Never Used  . Alcohol Use: No  . Drug Use: No  . Sexually Active: Not on file   Other Topics Concern  . Not on file   Social History Narrative  . No narrative on file   Review of Systems No nausea or vomiting Appetite is fine     Objective:   Physical Exam  Constitutional: She appears well-developed and well-nourished. No distress.       Coarse cough  HENT:  Head: Normocephalic and atraumatic.  Right Ear: External ear normal.  Left Ear: External ear normal.  Mouth/Throat: Oropharynx is clear and moist. No oropharyngeal exudate.       Mild frontal tenderness Nose has moderate inflammation and some yellow mucus on left  Neck: Normal range of  motion. Neck supple. No thyromegaly present.  Pulmonary/Chest: Effort normal. No respiratory distress. She has wheezes. She has no rales.       Not particularly tight but has exp wheezing  Lymphadenopathy:    She has no cervical adenopathy.          Assessment & Plan:

## 2010-11-18 NOTE — Patient Instructions (Addendum)
Please start the antibiotic now Start the prednisone if you have increased wheezing or shortness of breath Please stop smoking---use the 21 mg nicotine patch for 2-3 months, then wean down on 14 and 7mg  for a few weeks each

## 2010-11-18 NOTE — Assessment & Plan Note (Signed)
Seems to have bacterial infection Will go ahead with Rx with amoxicillin

## 2010-11-18 NOTE — Assessment & Plan Note (Signed)
Wheezing Discussed cigarette cessation--had patches from [facility] and recommended routine for this

## 2011-02-03 LAB — I-STAT 8, (EC8 V) (CONVERTED LAB)
Acid-Base Excess: 3 — ABNORMAL HIGH
Chloride: 102
TCO2: 28
pCO2, Ven: 40.5 — ABNORMAL LOW
pH, Ven: 7.436 — ABNORMAL HIGH

## 2011-02-03 LAB — POCT URINALYSIS DIP (DEVICE)
Bilirubin Urine: NEGATIVE
Ketones, ur: NEGATIVE
Operator id: 239701
Protein, ur: NEGATIVE
Specific Gravity, Urine: 1.015

## 2011-02-03 LAB — POCT I-STAT CREATININE: Operator id: 239701

## 2011-02-07 LAB — CBC
HCT: 30.2 — ABNORMAL LOW
Hemoglobin: 10.5 — ABNORMAL LOW
Hemoglobin: 12.1
MCHC: 34.5
MCV: 77.8 — ABNORMAL LOW
Platelets: 299
RBC: 3.88
RBC: 4.49
WBC: 12.4 — ABNORMAL HIGH
WBC: 13.3 — ABNORMAL HIGH

## 2011-02-16 LAB — CBC
HCT: 39.4
Hemoglobin: 13.4
MCHC: 33.9
MCV: 83.6
RBC: 4.71
RDW: 12.8

## 2011-02-16 LAB — COMPREHENSIVE METABOLIC PANEL
Alkaline Phosphatase: 40
BUN: 6
CO2: 24
Chloride: 103
GFR calc non Af Amer: >60
Glucose, Bld: 91
Potassium: 3.4 — ABNORMAL LOW
Total Bilirubin: 0.5
Total Protein: 7

## 2011-02-27 ENCOUNTER — Other Ambulatory Visit: Payer: Self-pay | Admitting: Family Medicine

## 2011-04-03 ENCOUNTER — Other Ambulatory Visit: Payer: Self-pay | Admitting: Family Medicine

## 2011-06-15 ENCOUNTER — Ambulatory Visit: Payer: 59 | Admitting: Family Medicine

## 2011-06-19 ENCOUNTER — Ambulatory Visit: Payer: 59 | Admitting: Family Medicine

## 2011-06-19 DIAGNOSIS — Z0289 Encounter for other administrative examinations: Secondary | ICD-10-CM

## 2011-07-12 ENCOUNTER — Ambulatory Visit: Payer: 59 | Admitting: Family Medicine

## 2011-07-17 ENCOUNTER — Ambulatory Visit: Payer: 59 | Admitting: Family Medicine

## 2011-07-17 DIAGNOSIS — Z0289 Encounter for other administrative examinations: Secondary | ICD-10-CM

## 2011-08-02 ENCOUNTER — Ambulatory Visit (INDEPENDENT_AMBULATORY_CARE_PROVIDER_SITE_OTHER): Payer: 59 | Admitting: Family Medicine

## 2011-08-02 VITALS — BP 130/84 | HR 91 | Temp 97.6°F | Ht 64.0 in | Wt 170.0 lb

## 2011-08-02 DIAGNOSIS — E785 Hyperlipidemia, unspecified: Secondary | ICD-10-CM

## 2011-08-02 DIAGNOSIS — E119 Type 2 diabetes mellitus without complications: Secondary | ICD-10-CM

## 2011-08-02 DIAGNOSIS — Z79899 Other long term (current) drug therapy: Secondary | ICD-10-CM

## 2011-08-02 DIAGNOSIS — E039 Hypothyroidism, unspecified: Secondary | ICD-10-CM

## 2011-08-02 NOTE — Progress Notes (Signed)
Patient Name: April Cortez Date of Birth: 23-Dec-1980 Age: 31 y.o. Medical Record Number: [id number] Gender: female Date of Encounter: 08/02/2011  History of Present Illness:  April Cortez is a 31 y.o. very pleasant female patient who presents with the following:  Wt Readings from Last 3 Encounters:  08/02/11 170 lb (77.111 kg)  11/18/10 183 lb (83.008 kg)  10/03/10 182 lb (82.555 kg)   BS has been higher If eats carbs, will go up over 160 Has some neuropathy in her feet also - 4 months ago. Notices more in the shower.   Diabetes Mellitus: Tolerating Medications: yes Compliance with diet: great Exercise: yes Avg blood sugars at home: 110-225 Foot problems: none Hypoglycemia: none No nausea, vomitting, blurred vision, polyuria.  Lab Results  Component Value Date   HGBA1C 7.2* 10/03/2010    Wt Readings from Last 3 Encounters:  08/02/11 170 lb (77.111 kg)  11/18/10 183 lb (83.008 kg)  10/03/10 182 lb (82.555 kg)    Body mass index is 29.18 kg/(m^2).  Lipids: Doing well, stable. No meds Panel reviewed with patient.  Lipids:    Component Value Date/Time   CHOL 138 10/03/2010 1100   TRIG 84.0 10/03/2010 1100   HDL 47.90 10/03/2010 1100   VLDL 16.8 10/03/2010 1100   CHOLHDL 3 10/03/2010 1100    Lab Results  Component Value Date   ALT 22 09/29/2008   AST 24 09/29/2008   ALKPHOS 55 09/29/2008   BILITOT 0.9 09/29/2008   BP stable  Thyroid: No symptoms. Labs reviewed. Denies cold / heat intolerance, dry skin, hair loss. No goiter.  Lab Results  Component Value Date   TSH 0.94 10/03/2010   Needs recheck thyroid labs  Past Medical History, Surgical History, Social History, Family History, Problem List, Medications, and Allergies have been reviewed and updated if relevant.  Review of Systems: Having some neuropathy occ No n/v No fevers No sob  Physical Examination: Filed Vitals:   08/02/11 1606  BP: 130/84  Pulse: 91  Temp: 97.6 F (36.4 C)  TempSrc: Oral  Height:  5\' 4"  (1.626 m)  Weight: 170 lb (77.111 kg)  SpO2: 98%    Body mass index is 29.18 kg/(m^2).   GEN: WDWN, NAD, Non-toxic, A & O x 3 HEENT: Atraumatic, Normocephalic. Neck supple. No masses, No LAD. Ears and Nose: No external deformity. CV: RRR, No M/G/R. No JVD. No thrill. No extra heart sounds. PULM: CTA B, no wheezes, crackles, rhonchi. No retractions. No resp. distress. No accessory muscle use. EXTR: No c/c/e NEURO Normal gait.  PSYCH: Normally interactive. Conversant. Not depressed or anxious appearing.  Calm demeanor.    Diabetic foot exam: Normal inspection No skin breakdown No calluses  Normal DP pulses Normal sensation to light tough Nails normal   Assessment and Plan:  1. DIABETES MELLITUS, TYPE II  Basic metabolic panel, Microalbumin / creatinine urine ratio, Hemoglobin A1c  2. HYPOTHYROIDISM  T3, free, T4, free, TSH  3. HYPERLIPIDEMIA  LDL cholesterol, direct  4. Encounter for long-term (current) use of other medications  CBC with Differential, Hepatic function panel   Check all above  a1c 7.4 recently  Orders Today: Orders Placed This Encounter  Procedures  . Basic metabolic panel  . CBC with Differential  . Hepatic function panel  . LDL cholesterol, direct  . T3, free  . T4, free  . TSH  . Microalbumin / creatinine urine ratio  . Hemoglobin A1c    Medications Today: Meds ordered this encounter  Medications  . amphetamine-dextroamphetamine (ADDERALL) 30 MG tablet    Sig: Take 30 mg by mouth daily.

## 2011-08-03 LAB — CBC WITH DIFFERENTIAL/PLATELET
Eosinophils Relative: 1.1 % (ref 0.0–5.0)
HCT: 45.2 % (ref 36.0–46.0)
Hemoglobin: 15.3 g/dL — ABNORMAL HIGH (ref 12.0–15.0)
Lymphocytes Relative: 36.3 % (ref 12.0–46.0)
Lymphs Abs: 3.5 10*3/uL (ref 0.7–4.0)
Monocytes Relative: 4.6 % (ref 3.0–12.0)
Neutro Abs: 5.5 10*3/uL (ref 1.4–7.7)
RBC: 5.01 Mil/uL (ref 3.87–5.11)
WBC: 9.6 10*3/uL (ref 4.5–10.5)

## 2011-08-03 LAB — LDL CHOLESTEROL, DIRECT: Direct LDL: 86.1 mg/dL

## 2011-08-03 LAB — TSH: TSH: 1.23 u[IU]/mL (ref 0.35–5.50)

## 2011-08-03 LAB — BASIC METABOLIC PANEL
Calcium: 9.6 mg/dL (ref 8.4–10.5)
GFR: 114.78 mL/min (ref >60.00)
Potassium: 4.5 mEq/L (ref 3.5–5.1)
Sodium: 136 mEq/L (ref 135–145)

## 2011-08-03 LAB — HEPATIC FUNCTION PANEL
ALT: 21 U/L (ref 0–35)
AST: 20 U/L (ref 0–37)
Albumin: 4.5 g/dL (ref 3.5–5.2)
Alkaline Phosphatase: 36 U/L — ABNORMAL LOW (ref 39–117)

## 2011-08-03 LAB — MICROALBUMIN / CREATININE URINE RATIO
Creatinine,U: 124.9 mg/dL
Microalb Creat Ratio: 0.7 mg/g (ref 0.0–30.0)

## 2011-08-03 LAB — HEMOGLOBIN A1C: Hgb A1c MFr Bld: 7.9 % — ABNORMAL HIGH (ref 4.6–6.5)

## 2011-08-04 ENCOUNTER — Encounter: Payer: Self-pay | Admitting: *Deleted

## 2011-09-04 ENCOUNTER — Other Ambulatory Visit: Payer: Self-pay | Admitting: *Deleted

## 2011-09-04 MED ORDER — ACCU-CHEK MULTICLIX LANCETS MISC: Status: DC

## 2011-09-06 ENCOUNTER — Other Ambulatory Visit: Payer: Self-pay | Admitting: Family Medicine

## 2011-10-09 ENCOUNTER — Telehealth: Payer: Self-pay | Admitting: Family Medicine

## 2011-10-09 NOTE — Telephone Encounter (Signed)
Patient called to ask you to put a referral in for her to see Dr Dorisann Frames [facility] . Please place referral in Epic.

## 2011-10-18 ENCOUNTER — Other Ambulatory Visit: Payer: Self-pay | Admitting: *Deleted

## 2011-10-18 MED ORDER — SITAGLIPTIN PHOSPHATE 25 MG PO TABS: 25.0000 mg | ORAL_TABLET | Freq: Every day | ORAL | Status: DC

## 2011-11-08 ENCOUNTER — Other Ambulatory Visit: Payer: Self-pay | Admitting: Family Medicine

## 2011-12-14 ENCOUNTER — Other Ambulatory Visit: Payer: Self-pay | Admitting: Family Medicine

## 2011-12-28 ENCOUNTER — Encounter: Payer: Self-pay | Admitting: Emergency Medicine

## 2011-12-28 ENCOUNTER — Emergency Department
Admission: EM | Admit: 2011-12-28 | Discharge: 2011-12-28 | Disposition: A | Discharge status: Home / Self Care | Payer: 59 | Source: Home / Self Care

## 2011-12-28 DIAGNOSIS — S61419A Laceration without foreign body of unspecified hand, initial encounter: Secondary | ICD-10-CM

## 2011-12-28 DIAGNOSIS — S61409A Unspecified open wound of unspecified hand, initial encounter: Secondary | ICD-10-CM

## 2011-12-28 DIAGNOSIS — E119 Type 2 diabetes mellitus without complications: Secondary | ICD-10-CM

## 2011-12-28 MED ORDER — DOXYCYCLINE HYCLATE 100 MG PO CAPS: ORAL_CAPSULE | ORAL | Status: DC

## 2011-12-28 NOTE — ED Provider Notes (Addendum)
History     CSN: [id number]  Arrival date & time 12/28/11  1312   First MD Initiated Contact with Patient 12/28/11 1313      Chief Complaint  Patient presents with  . Extremity Laceration   Patient is a 31 y.o. female presenting with skin laceration. The history is provided by the patient.  Laceration  Incident onset: earlier today  The laceration is located on the right hand. Size: 1-1.5 cm. Injury mechanism: Was cleaning candle when glass broke and cut hand  The pain is mild. The pain has been fluctuating since onset. She reports no foreign bodies present. Her tetanus status is UTD.  Pt also with baseline hx/o DM. Currently on metformin and glyburide.  Pt states that blood sugars have been poorly controlled.  Last A1C was around 9.  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    Past Medical History  Diagnosis Date  . Diabetes mellitus   . Hypothyroid   . Anxiety   . Allergic rhinitis due to pollen   . Hyperlipidemia   . Obesity   . IBS (irritable bowel syndrome)     Past Surgical History  Procedure Date  . Cholecystectomy 2007  . Tonsillectomy     31 YRS OLD    Family History  Problem Relation Age of Onset  . Hypertension Mother   . Diabetes Mother   . Hypertension Father   . Diabetes Father     History  Substance Use Topics  . Smoking status: Current Everyday Smoker -- 1.0 packs/day for 16 years    Types: Cigarettes  . Smokeless tobacco: Never Used  . Alcohol Use: Yes    OB History    Grav Para Term Preterm Abortions TAB SAB Ect Mult Living                  Review of Systems  All other systems reviewed and are negative.    Allergies  Review of patient's allergies indicates not on file.  Home Medications   Current Outpatient Rx  Name Route Sig Dispense Refill  . AMPHETAMINE-DEXTROAMPHETAMINE 30 MG PO TABS Oral Take 30 mg by mouth daily.    Marland Kitchen CALTRATE 600+D PLUS 600-400 MG-UNIT PO CHEW Oral Chew 1 tablet by mouth daily.      Marland Kitchen  CITALOPRAM HYDROBROMIDE 20 MG PO TABS Oral Take 1 tablet (20 mg total) by mouth daily. 90 tablet 3  . ACCU-CHEK MULTICLIX LANCETS MISC  1 each by Other route 2 (two) times daily. Use as instructed 102 each 11  . LEVONORGESTREL 20 MCG/24HR IU IUD Intrauterine 1 each by Intrauterine route once.      Marland Kitchen LISINOPRIL 2.5 MG PO TABS  TAKE 1 TABLET BY MOUTH DAILY 90 tablet 3  . METFORMIN HCL ER 500 MG PO TB24  TAKE 3 TABLETS BY MOUTH ONCE DAILY 90 tablet 0  . MOMETASONE FUROATE 0.1 % EX OINT Topical Apply 1 application topically daily.      Marland Kitchen SITAGLIPTIN PHOSPHATE 25 MG PO TABS Oral Take 1 tablet (25 mg total) by mouth daily. 30 tablet 2  . SYNTHROID 200 MCG PO TABS  TAKE 1 TABLET BY MOUTH DAILY 90 tablet 3  . TRUETEST TEST VI STRP  TEST TWICE DAILY 200 each PRN    BP 124/88  Pulse 89  Temp 98.2 F (36.8 C) (Oral)  Resp 16  Ht 5' 3.75" (1.619 m)  Wt 150 lb (68.04 kg)  BMI 25.95 kg/m2  SpO2 100%  LMP 12/25/2011  Physical Exam  Constitutional: She appears well-developed and well-nourished.  HENT:  Head: Normocephalic and atraumatic.  Eyes: Conjunctivae are normal. Pupils are equal, round, and reactive to light.  Neck: Normal range of motion. Neck supple.  Cardiovascular: Normal rate and regular rhythm.   Pulmonary/Chest: Effort normal and breath sounds normal.  Abdominal: Soft.  Musculoskeletal:       Hands:      Small 1 cm laceration on dorsum of R 2nd finger    Full flexion and extension of distal extremity   ED Course  Procedures (including critical care time) Laceration Repair:  Area soaked in chlorhexidine, then irrigated with sterile water. No foreign bodies present. Area then cleansed. Dermabond placed over affected area.  Labs Reviewed - No data to display No results found.   1. Laceration of hand       MDM  Dermabond placed at bedside. Relatively clean laceration without foreign body or debris.  Given diabetic status, pt at higher risk of secondary infection.  Pt  given prophylactic rx for doxycycline if area becomes infected in next 2-4 days. This was discussed with pt.  Discussed infectious red flags for reevaluation.  Follow up as needed.             Doree Albee, MD 12/28/11 1432  Doree Albee, MD 01/08/12 [phone]

## 2011-12-28 NOTE — ED Notes (Signed)
Rt first finger laceration today while cleaning a candle

## 2012-01-12 ENCOUNTER — Emergency Department (HOSPITAL_BASED_OUTPATIENT_CLINIC_OR_DEPARTMENT_OTHER)
Admission: EM | Admit: 2012-01-12 | Discharge: 2012-01-12 | Disposition: A | Discharge status: Home / Self Care | Payer: 59 | Attending: Emergency Medicine | Admitting: Emergency Medicine

## 2012-01-12 ENCOUNTER — Encounter (HOSPITAL_BASED_OUTPATIENT_CLINIC_OR_DEPARTMENT_OTHER): Payer: Self-pay | Admitting: *Deleted

## 2012-01-12 ENCOUNTER — Emergency Department (HOSPITAL_BASED_OUTPATIENT_CLINIC_OR_DEPARTMENT_OTHER): Payer: 59

## 2012-01-12 DIAGNOSIS — E669 Obesity, unspecified: Secondary | ICD-10-CM | POA: Insufficient documentation

## 2012-01-12 DIAGNOSIS — F17200 Nicotine dependence, unspecified, uncomplicated: Secondary | ICD-10-CM | POA: Insufficient documentation

## 2012-01-12 DIAGNOSIS — Z833 Family history of diabetes mellitus: Secondary | ICD-10-CM | POA: Insufficient documentation

## 2012-01-12 DIAGNOSIS — F411 Generalized anxiety disorder: Secondary | ICD-10-CM | POA: Insufficient documentation

## 2012-01-12 DIAGNOSIS — Z8249 Family history of ischemic heart disease and other diseases of the circulatory system: Secondary | ICD-10-CM | POA: Insufficient documentation

## 2012-01-12 DIAGNOSIS — E119 Type 2 diabetes mellitus without complications: Secondary | ICD-10-CM | POA: Insufficient documentation

## 2012-01-12 DIAGNOSIS — J45909 Unspecified asthma, uncomplicated: Secondary | ICD-10-CM | POA: Insufficient documentation

## 2012-01-12 DIAGNOSIS — R739 Hyperglycemia, unspecified: Secondary | ICD-10-CM

## 2012-01-12 LAB — URINALYSIS, ROUTINE W REFLEX MICROSCOPIC
Bilirubin Urine: NEGATIVE
Glucose, UA: 500 mg/dL — AB
Ketones, ur: 15 mg/dL — AB
Leukocytes, UA: NEGATIVE
Protein, ur: NEGATIVE mg/dL
pH: 7 (ref 5.0–8.0)

## 2012-01-12 LAB — CBC WITH DIFFERENTIAL/PLATELET
Basophils Absolute: 0 10*3/uL (ref 0.0–0.1)
Basophils Relative: 0 % (ref 0–1)
Eosinophils Absolute: 0.2 10*3/uL (ref 0.0–0.7)
Eosinophils Relative: 2 % (ref 0–5)
HCT: 42.6 % (ref 36.0–46.0)
Hemoglobin: 15.2 g/dL — ABNORMAL HIGH (ref 12.0–15.0)
MCH: 29.7 pg (ref 26.0–34.0)
MCHC: 35.7 g/dL (ref 30.0–36.0)
MCV: 83.4 fL (ref 78.0–100.0)
Monocytes Absolute: 0.4 10*3/uL (ref 0.1–1.0)
Monocytes Relative: 6 % (ref 3–12)
Neutro Abs: 3.8 10*3/uL (ref 1.7–7.7)
RDW: 12.7 % (ref 11.5–15.5)

## 2012-01-12 LAB — BASIC METABOLIC PANEL
BUN: 17 mg/dL (ref 6–23)
Calcium: 9.7 mg/dL (ref 8.4–10.5)
Chloride: 95 mEq/L — ABNORMAL LOW (ref 96–112)
Creatinine, Ser: 0.5 mg/dL (ref 0.50–1.10)
GFR calc Af Amer: >90 mL/min (ref >90)
GFR calc non Af Amer: >90 mL/min (ref >90)

## 2012-01-12 LAB — GLUCOSE, CAPILLARY: Glucose-Capillary: 193 mg/dL — ABNORMAL HIGH (ref 70–99)

## 2012-01-12 MED ORDER — PREDNISONE 20 MG PO TABS
ORAL_TABLET | ORAL | Status: AC
  Administered 2012-01-12: 60 mg
  Filled 2012-01-12: qty 3

## 2012-01-12 MED ORDER — IPRATROPIUM BROMIDE 0.02 % IN SOLN
0.5000 mg | Freq: Once | RESPIRATORY_TRACT | Status: AC
  Administered 2012-01-12: 0.5 mg via RESPIRATORY_TRACT

## 2012-01-12 MED ORDER — ALBUTEROL SULFATE HFA 108 (90 BASE) MCG/ACT IN AERS: 1.0000 | INHALATION_SPRAY | Freq: Four times a day (QID) | RESPIRATORY_TRACT | Status: DC | PRN

## 2012-01-12 MED ORDER — PREDNISONE 10 MG PO TABS: ORAL_TABLET | ORAL | Status: DC

## 2012-01-12 MED ORDER — ALBUTEROL SULFATE (5 MG/ML) 0.5% IN NEBU
INHALATION_SOLUTION | RESPIRATORY_TRACT | Status: AC
  Filled 2012-01-12: qty 1

## 2012-01-12 MED ORDER — PREDNISONE 50 MG PO TABS: 60.0000 mg | ORAL_TABLET | Freq: Every day | ORAL | Status: DC

## 2012-01-12 MED ORDER — IPRATROPIUM BROMIDE 0.02 % IN SOLN
RESPIRATORY_TRACT | Status: AC
  Filled 2012-01-12: qty 2.5

## 2012-01-12 MED ORDER — ALBUTEROL SULFATE HFA 108 (90 BASE) MCG/ACT IN AERS
INHALATION_SPRAY | RESPIRATORY_TRACT | Status: AC
  Administered 2012-01-12: 16:00:00
  Filled 2012-01-12: qty 6.7

## 2012-01-12 MED ORDER — ALBUTEROL SULFATE (5 MG/ML) 0.5% IN NEBU
5.0000 mg | INHALATION_SOLUTION | Freq: Once | RESPIRATORY_TRACT | Status: AC
  Administered 2012-01-12: 5 mg via RESPIRATORY_TRACT

## 2012-01-12 NOTE — ED Provider Notes (Signed)
Medical screening examination/treatment/procedure(s) were performed by non-physician practitioner and as supervising physician I was immediately available for consultation/collaboration.  Gerhard Munch, MD 01/12/12 [phone]

## 2012-01-12 NOTE — ED Notes (Signed)
The patient's CBG was 193.

## 2012-01-12 NOTE — ED Provider Notes (Signed)
History     CSN: [id number]  Arrival date & time 01/12/12  1221   First MD Initiated Contact with Patient 01/12/12 1244      Chief Complaint  Patient presents with  . Cough    (Consider location/radiation/quality/duration/timing/severity/associated sxs/prior treatment) Patient is a 31 y.o. female presenting with cough. The history is provided by the patient. No language interpreter was used.  Cough This is a new problem. The problem occurs constantly. The problem has been gradually worsening. The cough is productive of sputum. There has been no fever. Associated symptoms include shortness of breath and wheezing. She has tried nothing for the symptoms. She is a smoker. Her past medical history is significant for bronchitis and asthma.   Pt complains of an asthma attack.  Pt also is diabetic and has had elevated glucoses recently.  Pt reports she has had prednisone in the past and it has not caused her glucose to increase. Pt is respiratory therapist Past Medical History  Diagnosis Date  . Diabetes mellitus   . Hypothyroid   . Anxiety   . Allergic rhinitis due to pollen   . Hyperlipidemia   . Obesity   . IBS (irritable bowel syndrome)     Past Surgical History  Procedure Date  . Cholecystectomy 2007  . Tonsillectomy     31 YRS OLD    Family History  Problem Relation Age of Onset  . Hypertension Mother   . Diabetes Mother   . Hypertension Father   . Diabetes Father     History  Substance Use Topics  . Smoking status: Current Every Day Smoker -- 1.0 packs/day for 16 years    Types: Cigarettes  . Smokeless tobacco: Never Used  . Alcohol Use: Yes    OB History    Grav Para Term Preterm Abortions TAB SAB Ect Mult Living                  Review of Systems  Respiratory: Positive for cough, shortness of breath and wheezing.   All other systems reviewed and are negative.    Allergies  Review of patient's allergies indicates no known allergies.  Home  Medications   Current Outpatient Rx  Name Route Sig Dispense Refill  . GLIMEPIRIDE 4 MG PO TABS Oral Take 4 mg by mouth 2 (two) times daily.    . AMPHETAMINE-DEXTROAMPHETAMINE 30 MG PO TABS Oral Take 30 mg by mouth daily.    Marland Kitchen CALTRATE 600+D PLUS 600-400 MG-UNIT PO CHEW Oral Chew 1 tablet by mouth daily.      Marland Kitchen CITALOPRAM HYDROBROMIDE 20 MG PO TABS Oral Take 1 tablet (20 mg total) by mouth daily. 90 tablet 3  . DOXYCYCLINE HYCLATE 100 MG PO CAPS  1 tab bid x 10 days; use if wound becomes infected within next 2-4 days 20 capsule 0  . ACCU-CHEK MULTICLIX LANCETS MISC  1 each by Other route 2 (two) times daily. Use as instructed 102 each 11  . LEVONORGESTREL 20 MCG/24HR IU IUD Intrauterine 1 each by Intrauterine route once.      Marland Kitchen LISINOPRIL 2.5 MG PO TABS  TAKE 1 TABLET BY MOUTH DAILY 90 tablet 3  . METFORMIN HCL ER 500 MG PO TB24  TAKE 3 TABLETS BY MOUTH ONCE DAILY 90 tablet 0  . MOMETASONE FUROATE 0.1 % EX OINT Topical Apply 1 application topically daily.      Marland Kitchen SITAGLIPTIN PHOSPHATE 25 MG PO TABS Oral Take 1 tablet (25 mg total) by mouth  daily. 30 tablet 2  . SYNTHROID 200 MCG PO TABS  TAKE 1 TABLET BY MOUTH DAILY 90 tablet 3  . TRUETEST TEST VI STRP  TEST TWICE DAILY 200 each PRN    BP 115/75  Pulse 87  Temp 98 F (36.7 C) (Oral)  Resp 18  SpO2 97%  LMP 12/25/2011  Physical Exam  Nursing note and vitals reviewed. Constitutional: She appears well-developed and well-nourished.  HENT:  Head: Normocephalic and atraumatic.  Right Ear: External ear normal.  Left Ear: External ear normal.  Nose: Nose normal.  Mouth/Throat: Oropharynx is clear and moist.  Eyes: Conjunctivae normal and EOM are normal. Pupils are equal, round, and reactive to light.  Neck: Normal range of motion. Neck supple.  Cardiovascular: Normal rate and normal heart sounds.   Pulmonary/Chest: She has wheezes.  Abdominal: Soft.  Musculoskeletal: Normal range of motion.  Neurological: She is alert.  Skin: Skin  is warm.    ED Course  Procedures (including critical care time)  Labs Reviewed  GLUCOSE, CAPILLARY - Abnormal; Notable for the following:    Glucose-Capillary 193 (*)     All other components within normal limits   Dg Chest 2 View  01/12/2012  *RADIOLOGY REPORT*  Clinical Data: Productive cough, history of bronchitis  CHEST - 2 VIEW  Comparison: 05/16/2008  Findings: Cardiomediastinal silhouette is stable.  No acute infiltrate or pleural effusion.  No pulmonary edema.  Bony thorax is unremarkable.  IMPRESSION: No active disease.  No significant change.   Original Report Authenticated By: Natasha Mead, M.D.      1. Asthma   2. Hyperglycemia       MDM  Pt given albuterol neb and she feels better,  I will give 3 day course of prednisone.  Pt advised to monitor glucose carefully,  Return if any problems.       Lonia Skinner [place], [place] 01/12/12 1530  Lonia Skinner [place], [place] 01/12/12 1530

## 2012-01-12 NOTE — ED Notes (Signed)
Productive cough for 2 days with yellow sputum. Started as a cold. Hx of bronchitis.

## 2012-02-12 ENCOUNTER — Encounter: Payer: Self-pay | Admitting: Critical Care Medicine

## 2012-02-12 ENCOUNTER — Ambulatory Visit (INDEPENDENT_AMBULATORY_CARE_PROVIDER_SITE_OTHER): Payer: 59 | Admitting: Critical Care Medicine

## 2012-02-12 VITALS — BP 122/70 | HR 92 | Temp 98.1°F | Ht 64.0 in | Wt 154.0 lb

## 2012-02-12 DIAGNOSIS — J441 Chronic obstructive pulmonary disease with (acute) exacerbation: Secondary | ICD-10-CM | POA: Insufficient documentation

## 2012-02-12 DIAGNOSIS — F17200 Nicotine dependence, unspecified, uncomplicated: Secondary | ICD-10-CM

## 2012-02-12 DIAGNOSIS — Z720 Tobacco use: Secondary | ICD-10-CM

## 2012-02-12 DIAGNOSIS — R05 Cough: Secondary | ICD-10-CM

## 2012-02-12 MED ORDER — NICOTINE 10 MG IN INHA: RESPIRATORY_TRACT | Status: DC

## 2012-02-12 MED ORDER — MOMETASONE FUROATE 50 MCG/ACT NA SUSP: 2.0000 | Freq: Every day | NASAL | Status: DC

## 2012-02-12 MED ORDER — CEFUROXIME AXETIL 500 MG PO TABS: 500.0000 mg | ORAL_TABLET | Freq: Two times a day (BID) | ORAL | Status: DC

## 2012-02-12 MED ORDER — MOMETASONE FUROATE 220 MCG/INH IN AEPB: 2.0000 | INHALATION_SPRAY | Freq: Every day | RESPIRATORY_TRACT | Status: DC

## 2012-02-12 MED ORDER — PREDNISONE 10 MG PO TABS: ORAL_TABLET | ORAL | Status: DC

## 2012-02-12 MED ORDER — LOSARTAN POTASSIUM 25 MG PO TABS: 25.0000 mg | ORAL_TABLET | Freq: Every day | ORAL | Status: DC

## 2012-02-12 NOTE — Assessment & Plan Note (Signed)
Tobacco abuse smoking cessation counseling given to the pt.

## 2012-02-12 NOTE — Patient Instructions (Addendum)
Asmanex daily Stop lisinopril Start losartan 25mg  daily Use nicotrol for smoking cessation Prednisone pulse Ceftin for 7 days 250mg  bid Alpha one antitrypsin assay Return one month

## 2012-02-12 NOTE — Assessment & Plan Note (Addendum)
Asthmatic bronchitis with ongoing tobacco use Note mild small airway obstruction on spirometry Chest x-ray shows no active disease process Plan Asmanex daily Stop lisinopril Start losartan 25mg  daily Use nicotrol for smoking cessation Prednisone pulse Ceftin for 7 days 250mg  bid Alpha one antitrypsin assay Return one month

## 2012-02-12 NOTE — Progress Notes (Signed)
Subjective:    Patient ID: April Cortez, female    DOB: 12-26-1980, 31 y.o.   MRN: [id number]  HPI 31 y.o.WF Hx of bronchitis one month ago. Productive cough continues and wheezes at night. 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  Usually has this annually for 5-6 yrs.  No pfts yet. Mucus now is yellow pale and thick.  Notes some pndrip and R ear pain.  Allergies an issue Notes tinnitus bilateral. Notes some DOE when bronchitis was severe and went to ED.  Pt with BS elevated.  DM2, gestational.  Lost weight (106#)    Notes chest tightness.     Past Medical History  Diagnosis Date  . Diabetes mellitus   . Hypothyroid     Hx Graves Dz age 87.  s/p iodine radiactive  . Anxiety   . Allergic rhinitis due to pollen   . Hyperlipidemia   . Obesity   . IBS (irritable bowel syndrome)   . ADHD (attention deficit hyperactivity disorder)      Family History  Problem Relation Age of Onset  . Hypertension Mother   . Diabetes Mother   . Hypertension Father   . Diabetes Father   . Multiple myeloma Paternal Grandmother   . Breast cancer Maternal Grandmother   . Breast cancer      paternal great-grandmother     History   Social History  . Marital Status: Married    Spouse Name: N/A    Number of Children: N/A  . Years of Education: N/A   Occupational History  . RESPIRATORY THERAPIST    Social History Main Topics  . Smoking status: Current Every Day Smoker -- 1.0 packs/day    Types: Cigarettes  . Smokeless tobacco: Never Used   Comment: started smoking at age 16.  Currently smoking 1-1 1/2 ppd.  . Alcohol Use: Yes     socially  . Drug Use: No  . Sexually Active: Not on file   Other Topics Concern  . Not on file   Social History Narrative  . No narrative on file     No Known Allergies   Outpatient Prescriptions Prior to Visit  Medication Sig Dispense Refill  . albuterol (PROVENTIL HFA;VENTOLIN HFA) 108 (90 BASE) MCG/ACT inhaler Inhale 1-2 puffs into the lungs every 6 (six) hours  as needed for wheezing.  1 Inhaler  0  . amphetamine-dextroamphetamine (ADDERALL) 30 MG tablet Take 30 mg by mouth daily.      . Calcium Carbonate-Vit D-Min (CALTRATE 600+D PLUS) 600-400 MG-UNIT per tablet Chew 1 tablet by mouth daily.        Marland Kitchen glimepiride (AMARYL) 4 MG tablet Take 4 mg by mouth 2 (two) times daily.      Marland Kitchen levonorgestrel (MIRENA) 20 MCG/24HR IUD 1 each by Intrauterine route once.        . metFORMIN (GLUCOPHAGE-XR) 500 MG 24 hr tablet TAKE 3 TABLETS BY MOUTH ONCE DAILY  90 tablet  0  . mometasone (ELOCON) 0.1 % ointment Apply 1 application topically daily as needed.       Marland Kitchen SYNTHROID 200 MCG tablet TAKE 1 TABLET BY MOUTH DAILY  90 tablet  3  . TRUETEST TEST test strip TEST TWICE DAILY  200 each  PRN  . Lancets (ACCU-CHEK MULTICLIX) lancets 1 each by Other route 2 (two) times daily. Use as instructed  102 each  11  . lisinopril (PRINIVIL,ZESTRIL) 2.5 MG tablet TAKE 1 TABLET BY MOUTH DAILY  90 tablet  3  .  citalopram (CELEXA) 20 MG tablet Take 1 tablet (20 mg total) by mouth daily.  90 tablet  3  . doxycycline (VIBRAMYCIN) 100 MG capsule 1 tab bid x 10 days; use if wound becomes infected within next 2-4 days  20 capsule  0  . predniSONE (DELTASONE) 10 MG tablet 6 tablets a day for 2 days beginning 9/14  12 tablet  0  . sitaGLIPtin (JANUVIA) 25 MG tablet Take 1 tablet (25 mg total) by mouth daily.  30 tablet  2      Review of Systems  Constitutional: Positive for appetite change. Negative for fever, chills, diaphoresis, activity change, fatigue and unexpected weight change.  HENT: Positive for ear pain, congestion, postnasal drip, sinus pressure and tinnitus. Negative for hearing loss, nosebleeds, sore throat, facial swelling, rhinorrhea, sneezing, mouth sores, trouble swallowing, neck pain, neck stiffness, dental problem, voice change and ear discharge.   Eyes: Negative for photophobia, discharge, itching and visual disturbance.  Respiratory: Positive for cough, chest tightness,  shortness of breath and wheezing. Negative for apnea, choking and stridor.   Cardiovascular: Positive for chest pain. Negative for palpitations and leg swelling.  Gastrointestinal: Negative for nausea, vomiting, abdominal pain, constipation, blood in stool and abdominal distention.  Genitourinary: Negative for dysuria, urgency, frequency, hematuria, flank pain, decreased urine volume and difficulty urinating.  Musculoskeletal: Negative for myalgias, back pain, joint swelling, arthralgias and gait problem.  Skin: Negative for color change, pallor and rash.  Neurological: Positive for dizziness and light-headedness. Negative for tremors, seizures, syncope, speech difficulty, weakness, numbness and headaches.  Hematological: Negative for adenopathy. Does not bruise/bleed easily.  Psychiatric/Behavioral: Negative for confusion, disturbed wake/sleep cycle and agitation. The patient is nervous/anxious.        Objective:   Physical Exam  Filed Vitals:   02/12/12 0859  BP: 122/70  Pulse: 92  Temp: 98.1 F (36.7 C)  TempSrc: Oral  Height: 5\' 4"  (1.626 m)  Weight: 154 lb (69.854 kg)  SpO2: 100%    Gen: Pleasant, well-nourished, in no distress,  normal affect  ENT: No lesions,  mouth clear,  oropharynx clear, +++ postnasal drip  Neck: No JVD, no TMG, no carotid bruits  Lungs: No use of accessory muscles, no dullness to percussion,  Exp wheeze   Cardiovascular: RRR, heart sounds normal, no murmur or gallops, no peripheral edema  Abdomen: soft and NT, no HSM,  BS normal  Musculoskeletal: No deformities, no cyanosis or clubbing  Neuro: alert, non focal  Skin: Warm, no lesions or rashes  No results found.       Assessment & Plan:   Asthmatic bronchitis with smoking use Asthmatic bronchitis with ongoing tobacco use Note mild small airway obstruction on spirometry Chest x-ray shows no active disease process Plan Asmanex daily Stop lisinopril Start losartan 25mg  daily Use  nicotrol for smoking cessation Prednisone pulse Ceftin for 7 days 250mg  bid Nasonex daily Alpha one antitrypsin assay Return one month    Updated Medication List Outpatient Encounter Prescriptions as of 02/12/2012  Medication Sig Dispense Refill  . albuterol (PROVENTIL HFA;VENTOLIN HFA) 108 (90 BASE) MCG/ACT inhaler Inhale 1-2 puffs into the lungs every 6 (six) hours as needed for wheezing.  1 Inhaler  0  . amphetamine-dextroamphetamine (ADDERALL) 30 MG tablet Take 30 mg by mouth daily.      . Calcium Carbonate-Vit D-Min (CALTRATE 600+D PLUS) 600-400 MG-UNIT per tablet Chew 1 tablet by mouth daily.        . citalopram (CELEXA) 40 MG tablet Take 40  mg by mouth daily.      Marland Kitchen glimepiride (AMARYL) 4 MG tablet Take 4 mg by mouth 2 (two) times daily.      . insulin glargine (LANTUS) 100 UNIT/ML injection Inject 5 Units into the skin at bedtime.      . Lancets (ACCU-CHEK MULTICLIX) lancets 1 each as instructed      . levonorgestrel (MIRENA) 20 MCG/24HR IUD 1 each by Intrauterine route once.        . metFORMIN (GLUCOPHAGE-XR) 500 MG 24 hr tablet TAKE 3 TABLETS BY MOUTH ONCE DAILY  90 tablet  0  . mometasone (ELOCON) 0.1 % ointment Apply 1 application topically daily as needed.       . Nutritional Supplements (JUICE PLUS FIBRE PO) Take 1 tablet by mouth daily.      Marland Kitchen SYNTHROID 200 MCG tablet TAKE 1 TABLET BY MOUTH DAILY  90 tablet  3  . TRUETEST TEST test strip TEST TWICE DAILY  200 each  PRN  . DISCONTD: Lancets (ACCU-CHEK MULTICLIX) lancets 1 each by Other route 2 (two) times daily. Use as instructed  102 each  11  . DISCONTD: lisinopril (PRINIVIL,ZESTRIL) 2.5 MG tablet TAKE 1 TABLET BY MOUTH DAILY  90 tablet  3  . cefUROXime (CEFTIN) 500 MG tablet Take 1 tablet (500 mg total) by mouth 2 (two) times daily.  14 tablet  0  . losartan (COZAAR) 25 MG tablet Take 1 tablet (25 mg total) by mouth daily.  30 tablet  6  . mometasone (ASMANEX 120 METERED DOSES) 220 MCG/INH inhaler Inhale 2 puffs into the  lungs daily.  1 Inhaler  12  . mometasone (NASONEX) 50 MCG/ACT nasal spray Place 2 sprays into the nose daily.  17 g  6  . nicotine (NICOTROL) 10 MG inhaler 60-80 puff per cartridge, use 6-8 cartridges per day  168 each  2  . predniSONE (DELTASONE) 10 MG tablet Take 4 for two days three for two days two for two days one for two days  20 tablet  0  . DISCONTD: citalopram (CELEXA) 20 MG tablet Take 1 tablet (20 mg total) by mouth daily.  90 tablet  3  . DISCONTD: doxycycline (VIBRAMYCIN) 100 MG capsule 1 tab bid x 10 days; use if wound becomes infected within next 2-4 days  20 capsule  0  . DISCONTD: predniSONE (DELTASONE) 10 MG tablet 6 tablets a day for 2 days beginning 9/14  12 tablet  0  . DISCONTD: sitaGLIPtin (JANUVIA) 25 MG tablet Take 1 tablet (25 mg total) by mouth daily.  30 tablet  2

## 2012-02-13 NOTE — Progress Notes (Signed)
Quick Note:  Call pt and tell her alpha one antitrypsin labs is normal . No change in medications from what was Rx at the OV yesterday ______

## 2012-02-13 NOTE — Progress Notes (Signed)
Quick Note:  Called, spoke with pt. Informed her alpha one antitrypsin labs normal. No change in meds from what was rx at OV yesterday. She verbalized understanding and voiced no further questions/concerns at this time. ______

## 2012-02-16 ENCOUNTER — Ambulatory Visit: Payer: 59

## 2012-02-17 LAB — ALPHA-1 ANTITRYPSIN PHENOTYPE: A-1 Antitrypsin: 151 mg/dL (ref 83–199)

## 2012-02-20 NOTE — Progress Notes (Signed)
Quick Note:  lmomtcb -- per charting, pt is already aware of this. ______

## 2012-02-21 NOTE — Progress Notes (Signed)
Quick Note:  lmomtcb ______ 

## 2012-02-22 NOTE — Progress Notes (Signed)
Quick Note:  Called, spoke with pt. She is aware alpha 1 antitryspin was normal. She verbalized understanding and voiced no further questions/concerns at this time. ______

## 2012-02-22 NOTE — Progress Notes (Signed)
Quick Note:  lmomtcb ______ 

## 2012-03-04 ENCOUNTER — Ambulatory Visit (INDEPENDENT_AMBULATORY_CARE_PROVIDER_SITE_OTHER): Payer: Self-pay | Admitting: Family Medicine

## 2012-03-04 DIAGNOSIS — E119 Type 2 diabetes mellitus without complications: Secondary | ICD-10-CM

## 2012-03-04 NOTE — Progress Notes (Signed)
Patient presents today for 3 month DM follow-up as part of the employer-sponsored [employer] program. Medications, insulin regimen, and glucose readings have been reviewed. I have also discussed with patient lifestyle interventions such as diet and physical activity. Details of this visit can be found in [employer] documenting program through [facility] ([facility]). Patient has set a series of personal goals and will follow-up in 3 months for further review of DM.  

## 2012-03-11 ENCOUNTER — Ambulatory Visit: Payer: 59 | Admitting: Critical Care Medicine

## 2012-03-12 ENCOUNTER — Encounter: Payer: Self-pay | Admitting: Family Medicine

## 2012-03-12 NOTE — Progress Notes (Signed)
Patient ID: April Cortez, female   DOB: 22-Jun-1980, 31 y.o.   MRN: [id number] Reviewed and agree with documentation and management.

## 2012-03-23 ENCOUNTER — Emergency Department (HOSPITAL_COMMUNITY): Payer: 59

## 2012-03-23 ENCOUNTER — Emergency Department (HOSPITAL_COMMUNITY)
Admission: EM | Admit: 2012-03-23 | Discharge: 2012-03-23 | Disposition: A | Discharge status: Home / Self Care | Payer: 59 | Attending: Emergency Medicine | Admitting: Emergency Medicine

## 2012-03-23 ENCOUNTER — Encounter (HOSPITAL_COMMUNITY): Payer: Self-pay | Admitting: Emergency Medicine

## 2012-03-23 DIAGNOSIS — E785 Hyperlipidemia, unspecified: Secondary | ICD-10-CM | POA: Insufficient documentation

## 2012-03-23 DIAGNOSIS — E119 Type 2 diabetes mellitus without complications: Secondary | ICD-10-CM | POA: Insufficient documentation

## 2012-03-23 DIAGNOSIS — F411 Generalized anxiety disorder: Secondary | ICD-10-CM | POA: Insufficient documentation

## 2012-03-23 DIAGNOSIS — F909 Attention-deficit hyperactivity disorder, unspecified type: Secondary | ICD-10-CM | POA: Insufficient documentation

## 2012-03-23 DIAGNOSIS — R0602 Shortness of breath: Secondary | ICD-10-CM | POA: Insufficient documentation

## 2012-03-23 DIAGNOSIS — J309 Allergic rhinitis, unspecified: Secondary | ICD-10-CM | POA: Insufficient documentation

## 2012-03-23 DIAGNOSIS — E039 Hypothyroidism, unspecified: Secondary | ICD-10-CM | POA: Insufficient documentation

## 2012-03-23 DIAGNOSIS — R61 Generalized hyperhidrosis: Secondary | ICD-10-CM | POA: Insufficient documentation

## 2012-03-23 DIAGNOSIS — Z794 Long term (current) use of insulin: Secondary | ICD-10-CM | POA: Insufficient documentation

## 2012-03-23 DIAGNOSIS — K589 Irritable bowel syndrome without diarrhea: Secondary | ICD-10-CM | POA: Insufficient documentation

## 2012-03-23 DIAGNOSIS — R110 Nausea: Secondary | ICD-10-CM | POA: Insufficient documentation

## 2012-03-23 DIAGNOSIS — Z79899 Other long term (current) drug therapy: Secondary | ICD-10-CM | POA: Insufficient documentation

## 2012-03-23 DIAGNOSIS — E669 Obesity, unspecified: Secondary | ICD-10-CM | POA: Insufficient documentation

## 2012-03-23 DIAGNOSIS — R1013 Epigastric pain: Secondary | ICD-10-CM | POA: Insufficient documentation

## 2012-03-23 DIAGNOSIS — F17200 Nicotine dependence, unspecified, uncomplicated: Secondary | ICD-10-CM | POA: Insufficient documentation

## 2012-03-23 LAB — CBC WITH DIFFERENTIAL/PLATELET
HCT: 42.6 % (ref 36.0–46.0)
Hemoglobin: 15.2 g/dL — ABNORMAL HIGH (ref 12.0–15.0)
Lymphocytes Relative: 42 % (ref 12–46)
Monocytes Absolute: 0.6 10*3/uL (ref 0.1–1.0)
Monocytes Relative: 6 % (ref 3–12)
Neutro Abs: 4.2 10*3/uL (ref 1.7–7.7)
Neutrophils Relative %: 39 % — ABNORMAL LOW (ref 43–77)
RBC: 4.92 MIL/uL (ref 3.87–5.11)
WBC: 10.9 10*3/uL — ABNORMAL HIGH (ref 4.0–10.5)

## 2012-03-23 LAB — URINALYSIS, ROUTINE W REFLEX MICROSCOPIC
Glucose, UA: NEGATIVE mg/dL
Hgb urine dipstick: NEGATIVE
Leukocytes, UA: NEGATIVE
Protein, ur: NEGATIVE mg/dL
pH: 6.5 (ref 5.0–8.0)

## 2012-03-23 LAB — POCT I-STAT TROPONIN I: Troponin i, poc: 0 ng/mL (ref 0.00–0.08)

## 2012-03-23 LAB — BASIC METABOLIC PANEL
BUN: 12 mg/dL (ref 6–23)
CO2: 32 mEq/L (ref 19–32)
Chloride: 97 mEq/L (ref 96–112)
Creatinine, Ser: 0.61 mg/dL (ref 0.50–1.10)
GFR calc Af Amer: >90 mL/min (ref >90)
Potassium: 3.5 mEq/L (ref 3.5–5.1)

## 2012-03-23 LAB — HEPATIC FUNCTION PANEL
ALT: 18 U/L (ref 0–35)
AST: 21 U/L (ref 0–37)
Albumin: 4.1 g/dL (ref 3.5–5.2)
Total Protein: 7.6 g/dL (ref 6.0–8.3)

## 2012-03-23 LAB — POCT I-STAT, CHEM 8
BUN: 12 mg/dL (ref 6–23)
Calcium, Ion: 1.22 mmol/L (ref 1.12–1.23)
Chloride: 97 mEq/L (ref 96–112)
Creatinine, Ser: 0.8 mg/dL (ref 0.50–1.10)
Sodium: 140 mEq/L (ref 135–145)
TCO2: 30 mmol/L (ref 0–100)

## 2012-03-23 LAB — D-DIMER, QUANTITATIVE: D-Dimer, Quant: <0.27 ug/mL-FEU (ref 0.00–0.48)

## 2012-03-23 LAB — LIPASE, BLOOD: Lipase: 32 U/L (ref 11–59)

## 2012-03-23 MED ORDER — ONDANSETRON HCL 4 MG/2ML IJ SOLN
4.0000 mg | Freq: Once | INTRAMUSCULAR | Status: AC
  Administered 2012-03-23: 4 mg via INTRAVENOUS
  Filled 2012-03-23: qty 2

## 2012-03-23 MED ORDER — HYDROCODONE-ACETAMINOPHEN 5-325 MG PO TABS: 1.0000 | ORAL_TABLET | Freq: Four times a day (QID) | ORAL | Status: DC | PRN

## 2012-03-23 MED ORDER — SODIUM CHLORIDE 0.9 % IV BOLUS (SEPSIS)
250.0000 mL | Freq: Once | INTRAVENOUS | Status: AC
  Administered 2012-03-23: 250 mL via INTRAVENOUS

## 2012-03-23 MED ORDER — IOHEXOL 300 MG/ML  SOLN
100.0000 mL | Freq: Once | INTRAMUSCULAR | Status: AC | PRN
  Administered 2012-03-23: 100 mL via INTRAVENOUS

## 2012-03-23 MED ORDER — HYDROMORPHONE HCL PF 1 MG/ML IJ SOLN
1.0000 mg | Freq: Once | INTRAMUSCULAR | Status: AC
  Administered 2012-03-23: 1 mg via INTRAVENOUS
  Filled 2012-03-23: qty 1

## 2012-03-23 MED ORDER — PANTOPRAZOLE SODIUM 40 MG IV SOLR
40.0000 mg | Freq: Once | INTRAVENOUS | Status: AC
  Administered 2012-03-23: 40 mg via INTRAVENOUS
  Filled 2012-03-23: qty 40

## 2012-03-23 MED ORDER — FAMOTIDINE 20 MG PO TABS: 20.0000 mg | ORAL_TABLET | Freq: Two times a day (BID) | ORAL | Status: DC

## 2012-03-23 MED ORDER — HYDROMORPHONE HCL PF 1 MG/ML IJ SOLN
0.5000 mg | Freq: Once | INTRAMUSCULAR | Status: AC
  Administered 2012-03-23: 0.5 mg via INTRAVENOUS
  Filled 2012-03-23: qty 1

## 2012-03-23 MED ORDER — SODIUM CHLORIDE 0.9 % IV SOLN: INTRAVENOUS | Status: DC

## 2012-03-23 MED ORDER — IOHEXOL 300 MG/ML  SOLN: 20.0000 mL | INTRAMUSCULAR | Status: AC

## 2012-03-23 NOTE — ED Notes (Signed)
Pt reports experiencing severe mid sternal chest pain with shortness of breath, diaphoresis and nausea. Pt reports unable to catch her breath.

## 2012-03-23 NOTE — ED Notes (Signed)
Pt states understanding of discharge instructions 

## 2012-03-23 NOTE — ED Notes (Signed)
EKG performed.

## 2012-03-23 NOTE — ED Provider Notes (Signed)
History     CSN: [id number]  Arrival date & time 03/23/12  1505   First MD Initiated Contact with Patient 03/23/12 1535      Chief Complaint  Patient presents with  . Chest Pain    (Consider location/radiation/quality/duration/timing/severity/associated sxs/prior treatment) The history is provided by the patient.   patient is a 31 year old female with acute onset of epigastric and lower anterior chest pain associated with shortness of breath diaphoresis and nausea. Onset occurred about 30 minutes prior to arrival the pain was severe it was 10 out of 10 nonradiating to the back. Patient presented in some mild extremities. Has never had pain like this before does have a history of your bowel syndrome diabetes and hyperlipidemia.  Past Medical History  Diagnosis Date  . Diabetes mellitus   . Hypothyroid     Hx Graves Dz age 49.  s/p iodine radiactive  . Anxiety   . Allergic rhinitis due to pollen   . Hyperlipidemia   . Obesity   . IBS (irritable bowel syndrome)   . ADHD (attention deficit hyperactivity disorder)     Past Surgical History  Procedure Date  . Cholecystectomy 2007  . Tonsillectomy     31 YRS OLD  . Appendectomy     Family History  Problem Relation Age of Onset  . Hypertension Mother   . Diabetes Mother   . Hypertension Father   . Diabetes Father   . Multiple myeloma Paternal Grandmother   . Breast cancer Maternal Grandmother   . Breast cancer      paternal great-grandmother    History  Substance Use Topics  . Smoking status: Current Every Day Smoker -- 1.0 packs/day    Types: Cigarettes  . Smokeless tobacco: Never Used     Comment: started smoking at age 73.  Currently smoking 1-1 1/2 ppd.  . Alcohol Use: Yes     Comment: socially    OB History    Grav Para Term Preterm Abortions TAB SAB Ect Mult Living                  Review of Systems  Constitutional: Positive for diaphoresis. Negative for fever.  Respiratory: Positive for shortness  of breath.   Cardiovascular: Positive for chest pain. Negative for palpitations and leg swelling.  Gastrointestinal: Positive for nausea and abdominal pain. Negative for vomiting.  Genitourinary: Negative for dysuria.  Musculoskeletal: Negative for back pain.  Skin: Negative for rash.  Neurological: Negative for headaches.  Hematological: Does not bruise/bleed easily.    Allergies  Review of patient's allergies indicates no known allergies.  Home Medications   Current Outpatient Rx  Name  Route  Sig  Dispense  Refill  . ALBUTEROL SULFATE HFA 108 (90 BASE) MCG/ACT IN AERS   Inhalation   Inhale 1-2 puffs into the lungs every 6 (six) hours as needed for wheezing.   1 Inhaler   0   . AMPHETAMINE-DEXTROAMPHET ER 30 MG PO CP24   Oral   Take 30 mg by mouth 2 (two) times daily as needed. 30 mg every morning and a 2nd dose at noon if pt is at work         . CALTRATE 600+D PLUS 600-400 MG-UNIT PO CHEW   Oral   Chew 1 tablet by mouth daily.           Marland Kitchen CITALOPRAM HYDROBROMIDE 40 MG PO TABS   Oral   Take 40 mg by mouth daily.         Marland Kitchen  GLIMEPIRIDE 4 MG PO TABS   Oral   Take 4 mg by mouth 2 (two) times daily.         . INSULIN GLARGINE 100 UNIT/ML [place] SOLN   Subcutaneous   Inject 5-20 Units into the skin every morning. Based on sugar levels         . LEVONORGESTREL 20 MCG/24HR IU IUD   Intrauterine   1 each by Intrauterine route once. Implanted April 2009         . LEVOTHYROXINE SODIUM 200 MCG PO TABS   Oral   Take 200 mcg by mouth daily.         Marland Kitchen LOSARTAN POTASSIUM 25 MG PO TABS   Oral   Take 1 tablet (25 mg total) by mouth daily.   30 tablet   6   . METFORMIN HCL ER 500 MG PO TB24   Oral   Take 1,500 mg by mouth daily with breakfast.         . MOMETASONE FUROATE 220 MCG/INH IN AEPB   Inhalation   Inhale 2 puffs into the lungs daily.   1 Inhaler   12   . MOMETASONE FUROATE 0.1 % EX OINT   Topical   Apply 1 application topically daily as needed.  For eczema         . NICOTINE 10 MG IN INHA   Inhalation   Inhale 1 puff into the lungs daily as needed. For smoking cessation         . JUICE PLUS FIBRE PO   Oral   Take 1 tablet by mouth daily.         Marland Kitchen FAMOTIDINE 20 MG PO TABS   Oral   Take 1 tablet (20 mg total) by mouth 2 (two) times daily.   30 tablet   0   . HYDROCODONE-ACETAMINOPHEN 5-325 MG PO TABS   Oral   Take 1-2 tablets by mouth every 6 (six) hours as needed for pain.   10 tablet   0     BP 113/63  Pulse 70  Temp 97.5 F (36.4 C) (Oral)  Resp 16  SpO2 100%  LMP 02/28/2012  Physical Exam  Nursing note and vitals reviewed. Constitutional: She is oriented to person, place, and time. She appears well-developed and well-nourished. She appears distressed.  HENT:  Head: Normocephalic and atraumatic.  Mouth/Throat: Oropharynx is clear and moist.  Eyes: Conjunctivae normal and EOM are normal. Pupils are equal, round, and reactive to light.  Neck: Normal range of motion. Neck supple.  Cardiovascular: Normal rate, regular rhythm, normal heart sounds and intact distal pulses.   No murmur heard. Pulmonary/Chest: Effort normal and breath sounds normal. She has no wheezes. She has no rales. She exhibits no tenderness.  Abdominal: Soft. Bowel sounds are normal. There is no tenderness.  Musculoskeletal: Normal range of motion. She exhibits no edema.  Neurological: She is alert and oriented to person, place, and time. No cranial nerve deficit. She exhibits normal muscle tone. Coordination normal.  Skin: Skin is warm. No rash noted. She is not diaphoretic.    ED Course  Procedures (including critical care time)  Labs Reviewed  CBC WITH DIFFERENTIAL - Abnormal; Notable for the following:    WBC 10.9 (*)     Hemoglobin 15.2 (*)     Neutrophils Relative 39 (*)     Lymphs Abs 4.6 (*)     Eosinophils Relative 13 (*)     Eosinophils Absolute 1.5 (*)  All other components within normal limits  POCT I-STAT,  CHEM 8 - Abnormal; Notable for the following:    Glucose, Bld 69 (*)     Hemoglobin 15.3 (*)     All other components within normal limits  URINALYSIS, ROUTINE W REFLEX MICROSCOPIC - Abnormal; Notable for the following:    Specific Gravity, Urine >1.046 (*)     All other components within normal limits  BASIC METABOLIC PANEL  LIPASE, BLOOD  HEPATIC FUNCTION PANEL  POCT I-STAT TROPONIN I  D-DIMER, QUANTITATIVE  PREGNANCY, URINE   Ct Abdomen Pelvis W Contrast  03/23/2012  *RADIOLOGY REPORT*  Clinical Data: Epigastric and right upper quadrant pain.  CT ABDOMEN AND PELVIS WITH CONTRAST  Technique:  Multidetector CT imaging of the abdomen and pelvis was performed following the standard protocol during bolus administration of intravenous contrast.  Contrast: OMNIPAQUE IOHEXOL 300 MG/ML  SOLN  Comparison: 09/29/2008  Findings: Visualized lung bases clear.  There are clips in the gallbladder fossa. Unremarkable liver, spleen, adrenal glands, kidneys, pancreas, abdominal aorta.  Stomach is distended by ingested material.  Small bowel decompressed.  Vascular   clip near the base of the cecum.  Appendix is   surgically absent.  The colon is nondilated, unremarkable.  IUD projects in expected location. Adnexal regions unremarkable.  Urinary bladder incompletely distended.  There is no ascites.  No free air.  No adenopathy localized. Lumbar spine intact.  IMPRESSION: 1. No acute abdominal process. 2.  Postoperative changes as above.   Original Report Authenticated By: D. Andria Rhein, MD    Dg Chest Portable 1 View  03/23/2012  *RADIOLOGY REPORT*  Clinical Data: Chest pain.  CHEST - 1 VIEW  Comparison:  01/12/2012  Findings: The heart size and mediastinal contours are within normal limits.  Both lungs are clear.  IMPRESSION: No active disease.   Original Report Authenticated By: Irish Lack, M.D.      Date: 03/23/2012  Rate: 93  Rhythm: normal sinus rhythm  QRS Axis: normal  Intervals: normal   ST/T Wave abnormalities: nonspecific T wave changes  Conduction Disutrbances:none  Narrative Interpretation:   Old EKG Reviewed: none available  Results for orders placed during the hospital encounter of 03/23/12  CBC WITH DIFFERENTIAL      Component Value Range   WBC 10.9 (*) 4.0 - 10.5 K/uL   RBC 4.92  3.87 - 5.11 MIL/uL   Hemoglobin 15.2 (*) 12.0 - 15.0 g/dL   HCT 16.1  09.6 - 04.5 %   MCV 86.6  78.0 - 100.0 fL   MCH 30.9  26.0 - 34.0 pg   MCHC 35.7  30.0 - 36.0 g/dL   RDW 40.9  81.1 - 91.4 %   Platelets 319  150 - 400 K/uL   Neutrophils Relative 39 (*) 43 - 77 %   Neutro Abs 4.2  1.7 - 7.7 K/uL   Lymphocytes Relative 42  12 - 46 %   Lymphs Abs 4.6 (*) 0.7 - 4.0 K/uL   Monocytes Relative 6  3 - 12 %   Monocytes Absolute 0.6  0.1 - 1.0 K/uL   Eosinophils Relative 13 (*) 0 - 5 %   Eosinophils Absolute 1.5 (*) 0.0 - 0.7 K/uL   Basophils Relative 1  0 - 1 %   Basophils Absolute 0.1  0.0 - 0.1 K/uL  BASIC METABOLIC PANEL      Component Value Range   Sodium 138  135 - 145 mEq/L   Potassium 3.5  3.5 - 5.1 mEq/L   Chloride 97  96 - 112 mEq/L   CO2 32  19 - 32 mEq/L   Glucose, Bld 73  70 - 99 mg/dL   BUN 12  6 - 23 mg/dL   Creatinine, Ser 1.61  0.50 - 1.10 mg/dL   Calcium 09.6  8.4 - 04.5 mg/dL   GFR calc non Af Amer >90  >90 mL/min   GFR calc Af Amer >90  >90 mL/min  POCT I-STAT, CHEM 8      Component Value Range   Sodium 140  135 - 145 mEq/L   Potassium 3.5  3.5 - 5.1 mEq/L   Chloride 97  96 - 112 mEq/L   BUN 12  6 - 23 mg/dL   Creatinine, Ser 4.09  0.50 - 1.10 mg/dL   Glucose, Bld 69 (*) 70 - 99 mg/dL   Calcium, Ion 8.11  9.14 - 1.23 mmol/L   TCO2 30  0 - 100 mmol/L   Hemoglobin 15.3 (*) 12.0 - 15.0 g/dL   HCT 78.2  95.6 - 21.3 %  URINALYSIS, ROUTINE W REFLEX MICROSCOPIC      Component Value Range   Color, Urine YELLOW  YELLOW   APPearance CLEAR  CLEAR   Specific Gravity, Urine >1.046 (*) 1.005 - 1.030   pH 6.5  5.0 - 8.0   Glucose, UA NEGATIVE  NEGATIVE mg/dL    Hgb urine dipstick NEGATIVE  NEGATIVE   Bilirubin Urine NEGATIVE  NEGATIVE   Ketones, ur NEGATIVE  NEGATIVE mg/dL   Protein, ur NEGATIVE  NEGATIVE mg/dL   Urobilinogen, UA 1.0  0.0 - 1.0 mg/dL   Nitrite NEGATIVE  NEGATIVE   Leukocytes, UA NEGATIVE  NEGATIVE  LIPASE, BLOOD      Component Value Range   Lipase 32  11 - 59 U/L  HEPATIC FUNCTION PANEL      Component Value Range   Total Protein 7.6  6.0 - 8.3 g/dL   Albumin 4.1  3.5 - 5.2 g/dL   AST 21  0 - 37 U/L   ALT 18  0 - 35 U/L   Alkaline Phosphatase 44  39 - 117 U/L   Total Bilirubin 0.4  0.3 - 1.2 mg/dL   Bilirubin, Direct <0.8  0.0 - 0.3 mg/dL   Indirect Bilirubin NOT CALCULATED  0.3 - 0.9 mg/dL  POCT I-STAT TROPONIN I      Component Value Range   Troponin i, poc 0.00  0.00 - 0.08 ng/mL   Comment 3           D-DIMER, QUANTITATIVE      Component Value Range   D-Dimer, Quant <0.27  0.00 - 0.48 ug/mL-FEU     1. Epigastric abdominal pain       MDM  Workup in the emergency department for the epigastric lower anterior chest pain is negative. No evidence of PE d-dimer is negative troponin is negative EKG without acute changes. Symptoms not exactly consistent with an acute cardiac event. Liver function tests are normal lipase is normal not consistent with pancreatitis CT of the abdomen without any specific findings. Possibly could be related to peptic ulcer disease patient did improve with protonic 7 ED will continue on Pepcid she has a GI Dr. to followup with.        Shelda Jakes, MD 03/23/12 [phone]

## 2012-03-23 NOTE — ED Notes (Signed)
Myself and Heather, EMT undressed pt, in gown, on monitor, continuous pulse oximetry and blood pressure cuff 

## 2012-04-11 ENCOUNTER — Ambulatory Visit: Payer: 59 | Admitting: Critical Care Medicine

## 2012-05-23 ENCOUNTER — Ambulatory Visit: Payer: 59 | Admitting: Critical Care Medicine

## 2012-06-04 ENCOUNTER — Other Ambulatory Visit (HOSPITAL_COMMUNITY): Payer: Self-pay | Admitting: Neurosurgery

## 2012-06-04 DIAGNOSIS — M5416 Radiculopathy, lumbar region: Secondary | ICD-10-CM

## 2012-06-04 DIAGNOSIS — M545 Low back pain: Secondary | ICD-10-CM

## 2012-06-07 ENCOUNTER — Other Ambulatory Visit (HOSPITAL_COMMUNITY): Payer: Self-pay | Admitting: Neurosurgery

## 2012-06-07 ENCOUNTER — Ambulatory Visit (HOSPITAL_COMMUNITY)
Admission: RE | Admit: 2012-06-07 | Discharge: 2012-06-07 | Disposition: A | Discharge status: Home / Self Care | Payer: 59 | Source: Ambulatory Visit | Attending: Neurosurgery | Admitting: Neurosurgery

## 2012-06-07 ENCOUNTER — Other Ambulatory Visit: Payer: Self-pay | Admitting: Neurosurgery

## 2012-06-07 DIAGNOSIS — M5416 Radiculopathy, lumbar region: Secondary | ICD-10-CM

## 2012-06-07 DIAGNOSIS — Q7649 Other congenital malformations of spine, not associated with scoliosis: Secondary | ICD-10-CM | POA: Insufficient documentation

## 2012-06-07 DIAGNOSIS — M545 Low back pain: Secondary | ICD-10-CM

## 2012-06-07 DIAGNOSIS — IMO0002 Reserved for concepts with insufficient information to code with codable children: Secondary | ICD-10-CM | POA: Insufficient documentation

## 2012-06-07 DIAGNOSIS — M79609 Pain in unspecified limb: Secondary | ICD-10-CM | POA: Insufficient documentation

## 2012-06-07 DIAGNOSIS — M5126 Other intervertebral disc displacement, lumbar region: Secondary | ICD-10-CM | POA: Insufficient documentation

## 2012-06-07 DIAGNOSIS — M549 Dorsalgia, unspecified: Secondary | ICD-10-CM

## 2012-06-07 DIAGNOSIS — M51379 Other intervertebral disc degeneration, lumbosacral region without mention of lumbar back pain or lower extremity pain: Secondary | ICD-10-CM | POA: Insufficient documentation

## 2012-06-07 DIAGNOSIS — M5137 Other intervertebral disc degeneration, lumbosacral region: Secondary | ICD-10-CM | POA: Insufficient documentation

## 2012-07-09 ENCOUNTER — Other Ambulatory Visit (HOSPITAL_COMMUNITY): Payer: Self-pay | Admitting: Neurosurgery

## 2012-07-09 DIAGNOSIS — G35 Multiple sclerosis: Secondary | ICD-10-CM

## 2012-07-11 ENCOUNTER — Ambulatory Visit (HOSPITAL_COMMUNITY): Payer: 59

## 2012-07-16 ENCOUNTER — Ambulatory Visit (INDEPENDENT_AMBULATORY_CARE_PROVIDER_SITE_OTHER): Payer: Self-pay | Admitting: Family Medicine

## 2012-07-16 ENCOUNTER — Ambulatory Visit (HOSPITAL_COMMUNITY)
Admission: RE | Admit: 2012-07-16 | Discharge: 2012-07-16 | Disposition: A | Discharge status: Home / Self Care | Payer: 59 | Source: Ambulatory Visit | Attending: Neurosurgery | Admitting: Neurosurgery

## 2012-07-16 DIAGNOSIS — R209 Unspecified disturbances of skin sensation: Secondary | ICD-10-CM | POA: Insufficient documentation

## 2012-07-16 DIAGNOSIS — R5381 Other malaise: Secondary | ICD-10-CM | POA: Insufficient documentation

## 2012-07-16 DIAGNOSIS — G35 Multiple sclerosis: Secondary | ICD-10-CM

## 2012-07-16 DIAGNOSIS — E119 Type 2 diabetes mellitus without complications: Secondary | ICD-10-CM

## 2012-07-16 LAB — CREATININE, SERUM
GFR calc Af Amer: >90 mL/min (ref >90)
GFR calc non Af Amer: >90 mL/min (ref >90)

## 2012-07-16 MED ORDER — GADOBENATE DIMEGLUMINE 529 MG/ML IV SOLN
15.0000 mL | Freq: Once | INTRAVENOUS | Status: AC | PRN
  Administered 2012-07-16: 14 mL via INTRAVENOUS

## 2012-07-16 NOTE — Progress Notes (Signed)
Patient presents today for DM follow-up as part of employer-sponsored Link to [employer]. Medications, glucose readings, a1c and compliance have been reviewed. I have also discussed with patient lifestyle interventions such as diet and exercise. Details of the visit can be found in [employer] documenting program through [employer] [facility]). Patient has set a series of personal goals and will follow-up in no more than 3 months for further review of DM. Discussion today centered around frequency of hypoglycemic events and need for a dilated eye exam. Pt. Reports stopping smoking on 07/10/12.

## 2012-07-30 NOTE — Progress Notes (Signed)
Patient ID: April Cortez, female   DOB: 1980-06-15, 32 y.o.   MRN: [id number] ATTENDING PHYSICIAN NOTE: I have reviewed the chart and agree with the plan as detailed above. Denny Levy MD Pager [phone]

## 2012-08-15 ENCOUNTER — Other Ambulatory Visit: Payer: Self-pay | Admitting: Critical Care Medicine

## 2012-09-25 ENCOUNTER — Other Ambulatory Visit: Payer: Self-pay | Admitting: Critical Care Medicine

## 2012-09-26 NOTE — Telephone Encounter (Signed)
Pt was last seen by PW on 02/12/12 with the following pt instructions:  Patient Instructions    Asmanex daily  Stop lisinopril  Start losartan 25mg  daily  Use nicotrol for smoking cessation  Prednisone pulse  Ceftin for 7 days 250mg  bid  Alpha one antitrypsin assay  Return one month   ------  Received refill request for losartan.  Per Dr. Delford Field:  He would like to have PCP to manage and refill this please.  Will defer rx to PCP through pharm.

## 2012-09-27 ENCOUNTER — Other Ambulatory Visit: Payer: Self-pay | Admitting: *Deleted

## 2012-09-27 MED ORDER — LOSARTAN POTASSIUM 25 MG PO TABS: ORAL_TABLET | ORAL | Status: AC

## 2012-10-01 ENCOUNTER — Emergency Department
Admission: EM | Admit: 2012-10-01 | Discharge: 2012-10-01 | Disposition: A | Discharge status: Home / Self Care | Payer: 59 | Source: Home / Self Care | Attending: Family Medicine | Admitting: Family Medicine

## 2012-10-01 ENCOUNTER — Encounter: Payer: Self-pay | Admitting: *Deleted

## 2012-10-01 DIAGNOSIS — L255 Unspecified contact dermatitis due to plants, except food: Secondary | ICD-10-CM

## 2012-10-01 MED ORDER — TRIAMCINOLONE ACETONIDE 40 MG/ML IJ SUSP
40.0000 mg | Freq: Once | INTRAMUSCULAR | Status: AC
  Administered 2012-10-01: 40 mg via INTRAMUSCULAR

## 2012-10-01 NOTE — ED Provider Notes (Signed)
History     CSN: [id number]  Arrival date & time 10/01/12  1431   First MD Initiated Contact with Patient 10/01/12 1506      Chief Complaint  Patient presents with  . Rash      HPI Comments: Patient had been working in her yard about 10 days ago, and developed a rash the next day that has persisted.  She has had mild improvement with HC cream and Benadryl.  She feels well otherwise.  Patient is a 32 y.o. female presenting with poison ivy. The history is provided by the patient.  Poison [place] This is a new problem. Episode onset: 9 days ago. The problem occurs constantly. The problem has not changed since onset.Associated symptoms comments: none. Nothing aggravates the symptoms. Nothing relieves the symptoms. Treatments tried: hydrocortisone cream and Benadryl. The treatment provided mild relief.    Past Medical History  Diagnosis Date  . Diabetes mellitus   . Hypothyroid     Hx Graves Dz age 1.  s/p iodine radiactive  . Anxiety   . Allergic rhinitis due to pollen   . Hyperlipidemia   . Obesity   . IBS (irritable bowel syndrome)   . ADHD (attention deficit hyperactivity disorder)     Past Surgical History  Procedure Laterality Date  . Cholecystectomy  2007  . Tonsillectomy      32 YRS OLD  . Appendectomy      Family History  Problem Relation Age of Onset  . Hypertension Mother   . Diabetes Mother   . Hypertension Father   . Diabetes Father   . Multiple myeloma Paternal Grandmother   . Breast cancer Maternal Grandmother   . Breast cancer      paternal great-grandmother    History  Substance Use Topics  . Smoking status: Current Every Day Smoker -- 1.00 packs/day    Types: Cigarettes  . Smokeless tobacco: Never Used     Comment: started smoking at age 2.  Currently smoking 1-1 1/2 ppd.  . Alcohol Use: Yes     Comment: socially    OB History   Grav Para Term Preterm Abortions TAB SAB Ect Mult Living                  Review of Systems  All other systems  reviewed and are negative.    Allergies  Review of patient's allergies indicates no known allergies.  Home Medications   Current Outpatient Rx  Name  Route  Sig  Dispense  Refill  . albuterol (PROVENTIL HFA;VENTOLIN HFA) 108 (90 BASE) MCG/ACT inhaler   Inhalation   Inhale 1-2 puffs into the lungs every 6 (six) hours as needed for wheezing.   1 Inhaler   0   . amphetamine-dextroamphetamine (ADDERALL XR) 30 MG 24 hr capsule   Oral   Take 30 mg by mouth 2 (two) times daily as needed. 30 mg every morning and a 2nd dose at noon if pt is at work         . Calcium Carbonate-Vit D-Min (CALTRATE 600+D PLUS) 600-400 MG-UNIT per tablet   Oral   Chew 1 tablet by mouth daily.           . citalopram (CELEXA) 40 MG tablet   Oral   Take 40 mg by mouth daily.         . famotidine (PEPCID) 20 MG tablet   Oral   Take 1 tablet (20 mg total) by mouth 2 (two) times daily.  30 tablet   0   . HYDROcodone-acetaminophen (NORCO/VICODIN) 5-325 MG per tablet   Oral   Take 1-2 tablets by mouth every 6 (six) hours as needed for pain.   10 tablet   0   . insulin aspart protamine-insulin aspart (NOVOLOG 70/30) (70-30) 100 UNIT/ML injection   Subcutaneous   Inject 10 Units into the skin 2 (two) times daily. 10 Units in AM and 12 Units in PM         . levothyroxine (SYNTHROID, LEVOTHROID) 175 MCG tablet   Oral   Take 175 mcg by mouth daily.         Marland Kitchen losartan (COZAAR) 25 MG tablet      TAKE 1 TABLET (25 MG TOTAL) BY MOUTH DAILY. * Needs appointment for additional refills*   30 tablet   1   . metFORMIN (GLUCOPHAGE-XR) 500 MG 24 hr tablet   Oral   Take 1,500 mg by mouth daily with breakfast.         . mometasone (ASMANEX 120 METERED DOSES) 220 MCG/INH inhaler   Inhalation   Inhale 2 puffs into the lungs daily.   1 Inhaler   12   . mometasone (ELOCON) 0.1 % ointment   Topical   Apply 1 application topically daily as needed. For eczema         . nicotine (NICOTROL) 10 MG  inhaler   Inhalation   Inhale 1 puff into the lungs daily as needed. For smoking cessation         . Nutritional Supplements (JUICE PLUS FIBRE PO)   Oral   Take 1 tablet by mouth daily.           BP 114/78  Pulse 91  Temp(Src) 98.4 F (36.9 C) (Oral)  Resp 18  Ht 5\' 4"  (1.626 m)  Wt 170 lb (77.111 kg)  BMI 29.17 kg/m2  SpO2 99%  Physical Exam  Nursing note and vitals reviewed. Constitutional: She is oriented to person, place, and time. She appears well-developed and well-nourished. No distress.  HENT:  Head: Normocephalic and atraumatic.  Mouth/Throat: Oropharynx is clear and moist.  Eyes: Conjunctivae are normal. Pupils are equal, round, and reactive to light.  Cardiovascular: Normal heart sounds.   Pulmonary/Chest: Breath sounds normal.  Lymphadenopathy:    She has no cervical adenopathy.  Neurological: She is alert and oriented to person, place, and time.  Skin: Skin is warm and dry. Rash noted. Rash is maculopapular. Rash is not vesicular.     On trunk, arms, and neck there are scattered erythematous macular lesions in distribution as noted on diagram.    ED Course  Procedures  none      1. Rhus dermatitis       MDM  Kenalog 40mg  IM May take Benadryl 50mg  at bedtime for itching. Followup with Family Doctor if not improved in one week.         Lattie Haw, MD 10/04/12 [phone]

## 2012-10-01 NOTE — ED Notes (Signed)
Pt c/o rash all over x 10 days. She has applied Benadryl and hydrocortisone cream with some relief.

## 2012-10-05 ENCOUNTER — Telehealth: Payer: Self-pay

## 2012-10-05 NOTE — ED Notes (Signed)
Left a message on voice mail asking how patient is feeling and advising to call back with any questions or concerns.  

## 2012-11-07 ENCOUNTER — Other Ambulatory Visit: Payer: Self-pay

## 2012-11-19 ENCOUNTER — Other Ambulatory Visit: Payer: Self-pay | Admitting: Neurosurgery

## 2012-11-19 DIAGNOSIS — M545 Low back pain: Secondary | ICD-10-CM

## 2012-11-20 ENCOUNTER — Ambulatory Visit (INDEPENDENT_AMBULATORY_CARE_PROVIDER_SITE_OTHER): Payer: Self-pay | Admitting: Family Medicine

## 2012-11-20 DIAGNOSIS — E119 Type 2 diabetes mellitus without complications: Secondary | ICD-10-CM

## 2012-11-20 NOTE — Progress Notes (Signed)
Patient presents today for DM follow-up as part of employer-sponsored Link to [facility] Program. Medications, glucose readings, a1c and compliance have been reviewed. I have also discussed with patient lifestyle interventions including diet and exercise. Details of the visit can be found in [employer] documenting program through [facility] Patient has set a series of personal goals and will follow-up in no more than 3 months for further review of DM. 

## 2012-11-26 NOTE — Progress Notes (Signed)
Patient ID: April Cortez, female   DOB: 11/17/1980, 32 y.o.   MRN: [id number] ATTENDING PHYSICIAN NOTE: I have reviewed the chart and agree with the plan as detailed above. Denny Levy MD Pager [phone]

## 2012-11-28 ENCOUNTER — Ambulatory Visit
Admission: RE | Admit: 2012-11-28 | Discharge: 2012-11-28 | Disposition: A | Discharge status: Home / Self Care | Payer: 59 | Source: Ambulatory Visit | Attending: Neurosurgery | Admitting: Neurosurgery

## 2012-11-28 DIAGNOSIS — M545 Low back pain: Secondary | ICD-10-CM

## 2012-11-28 MED ORDER — IOHEXOL 180 MG/ML  SOLN
1.0000 mL | Freq: Once | INTRAMUSCULAR | Status: AC | PRN
  Administered 2012-11-28: 1 mL via EPIDURAL

## 2012-11-28 MED ORDER — METHYLPREDNISOLONE ACETATE 40 MG/ML INJ SUSP (RADIOLOG
120.0000 mg | Freq: Once | INTRAMUSCULAR | Status: AC
  Administered 2012-11-28: 120 mg via EPIDURAL

## 2012-12-17 ENCOUNTER — Other Ambulatory Visit: Payer: Self-pay | Admitting: Neurosurgery

## 2012-12-17 DIAGNOSIS — M5416 Radiculopathy, lumbar region: Secondary | ICD-10-CM

## 2012-12-17 DIAGNOSIS — M545 Low back pain: Secondary | ICD-10-CM

## 2012-12-19 ENCOUNTER — Ambulatory Visit
Admission: RE | Admit: 2012-12-19 | Discharge: 2012-12-19 | Disposition: A | Discharge status: Home / Self Care | Payer: 59 | Source: Ambulatory Visit | Attending: Neurosurgery | Admitting: Neurosurgery

## 2012-12-19 DIAGNOSIS — M545 Low back pain: Secondary | ICD-10-CM

## 2012-12-19 DIAGNOSIS — M5416 Radiculopathy, lumbar region: Secondary | ICD-10-CM

## 2012-12-19 MED ORDER — METHYLPREDNISOLONE ACETATE 40 MG/ML INJ SUSP (RADIOLOG
120.0000 mg | Freq: Once | INTRAMUSCULAR | Status: AC
  Administered 2012-12-19: 120 mg via EPIDURAL

## 2012-12-19 MED ORDER — IOHEXOL 180 MG/ML  SOLN
1.0000 mL | Freq: Once | INTRAMUSCULAR | Status: AC | PRN
  Administered 2012-12-19: 1 mL via EPIDURAL

## 2013-02-05 NOTE — Progress Notes (Signed)
Patient presents today for DM follow-up as part of employer-sponsored Link to [employer]. Medications, glucose readings, a1c and compliance have been reviewed. I have also discussed with patient lifestyle interventions including diet and exercise. Details of the visit can be found in [employer] documenting program through [employer] [facility]). Patient has set a series of personal goals and will follow-up in no more than 3 months for further review of DM. Today we specifically discussed establishing a quit date for smoking (02/25/13), making an appointment with Idolina Primer for dietary counseling regarding meal choices while on insulin, and increasing exercise (bike and/or gym). A1c 7.3%

## 2013-02-06 ENCOUNTER — Ambulatory Visit (INDEPENDENT_AMBULATORY_CARE_PROVIDER_SITE_OTHER): Payer: 59 | Admitting: Family Medicine

## 2013-02-06 DIAGNOSIS — E119 Type 2 diabetes mellitus without complications: Secondary | ICD-10-CM

## 2013-02-24 ENCOUNTER — Ambulatory Visit: Payer: 59 | Admitting: *Deleted

## 2013-03-12 ENCOUNTER — Other Ambulatory Visit: Payer: Self-pay | Admitting: Obstetrics and Gynecology

## 2013-04-22 NOTE — Progress Notes (Signed)
Patient ID: April Cortez, female   DOB: 1981/01/14, 32 y.o.   MRN: [id number] ATTENDING PHYSICIAN NOTE: I have reviewed the chart and agree with the plan as detailed above. Denny Levy MD Pager [phone]

## 2013-07-04 ENCOUNTER — Encounter: Payer: Self-pay | Admitting: Physician Assistant

## 2013-07-04 ENCOUNTER — Other Ambulatory Visit: Payer: Self-pay | Admitting: Neurosurgery

## 2013-07-04 ENCOUNTER — Ambulatory Visit (INDEPENDENT_AMBULATORY_CARE_PROVIDER_SITE_OTHER): Payer: 59

## 2013-07-04 ENCOUNTER — Ambulatory Visit (INDEPENDENT_AMBULATORY_CARE_PROVIDER_SITE_OTHER): Payer: 59 | Admitting: Physician Assistant

## 2013-07-04 VITALS — BP 113/66 | HR 92 | Ht 64.5 in | Wt 171.0 lb

## 2013-07-04 DIAGNOSIS — M545 Low back pain, unspecified: Secondary | ICD-10-CM

## 2013-07-04 DIAGNOSIS — M5416 Radiculopathy, lumbar region: Secondary | ICD-10-CM

## 2013-07-04 DIAGNOSIS — M79609 Pain in unspecified limb: Secondary | ICD-10-CM

## 2013-07-04 DIAGNOSIS — M2560 Stiffness of unspecified joint, not elsewhere classified: Secondary | ICD-10-CM

## 2013-07-04 DIAGNOSIS — F32A Depression, unspecified: Secondary | ICD-10-CM

## 2013-07-04 DIAGNOSIS — M79642 Pain in left hand: Principal | ICD-10-CM

## 2013-07-04 DIAGNOSIS — F329 Major depressive disorder, single episode, unspecified: Secondary | ICD-10-CM

## 2013-07-04 DIAGNOSIS — F3289 Other specified depressive episodes: Secondary | ICD-10-CM

## 2013-07-04 DIAGNOSIS — M79641 Pain in right hand: Secondary | ICD-10-CM

## 2013-07-04 NOTE — Progress Notes (Signed)
Subjective:    Patient ID: April Cortez, female    DOB: 1980/06/23, 33 y.o.   MRN: [id number]  HPI Patient is a 33 year old female who presents to the clinic today to establish care.  . Active Ambulatory Problems    Diagnosis Date Noted  . HYPOTHYROIDISM 06/14/2008  . DM neuropathy, type II diabetes mellitus 05/26/2009  . HYPERLIPIDEMIA 06/28/2009  . ANXIETY 06/15/2008  . ALLERGIC RHINITIS 06/15/2008  . ECZEMA 06/15/2008  . BACK PAIN, LUMBAR 07/15/2008  . Tobacco abuse 02/12/2012  . Asthmatic bronchitis with smoking use 02/12/2012   Resolved Ambulatory Problems    Diagnosis Date Noted  . URI 05/06/2009  . Acute sinusitis, unspecified 11/18/2010  . Acute asthmatic bronchitis 11/18/2010   Past Medical History  Diagnosis Date  . Diabetes mellitus   . Hypothyroid   . Anxiety   . Allergic rhinitis due to pollen   . Hyperlipidemia   . Obesity   . IBS (irritable bowel syndrome)   . ADHD (attention deficit hyperactivity disorder)    . Family History  Problem Relation Age of Onset  . Hypertension Mother   . Diabetes Mother   . Depression Mother   . Hypertension Father   . Diabetes Father   . Multiple myeloma Paternal Grandmother   . Breast cancer Maternal Grandmother   . Diabetes Maternal Grandmother   . Breast cancer      paternal great-grandmother  . Diabetes Paternal Grandfather    . History   Social History  . Marital Status: Married    Spouse Name: N/A    Number of Children: N/A  . Years of Education: N/A   Occupational History  . RESPIRATORY THERAPIST    Social History Main Topics  . Smoking status: Current Every Day Smoker -- 1.00 packs/day    Types: Cigarettes  . Smokeless tobacco: Never Used     Comment: started smoking at age 60.  Currently smoking 1-1 1/2 ppd.  . Alcohol Use: Yes     Comment: socially  . Drug Use: No  . Sexual Activity: Yes   Other Topics Concern  . Not on file   Social History Narrative  . No narrative on file     Patient has been diagnosed with ongoing chronic problems however they're mostly managed by specialists. Today she has a concern with generalized pain and swelling in her hands. This has been: On for the last 6 months. She notices a lot of morning stiffness. She feels like nodules are developing on her joints of her hands. She feels like her hands are easily able to get cold. Hands will often turn red or purplish color. She is having problems opening up things like cans or soft drinks.  She's concerned she could have something serious going on.    Review of Systems  All other systems reviewed and are negative.       Objective:   Physical Exam  Constitutional: She is oriented to person, place, and time. She appears well-developed and well-nourished.  HENT:  Head: Normocephalic and atraumatic.  Cardiovascular: Normal rate, regular rhythm and normal heart sounds.   Pulmonary/Chest: Effort normal and breath sounds normal. She has no wheezes.  Musculoskeletal:  Handgrip was 5 out of 5 bilaterally. There was no pain with direct palpation over joints of bilateral hands. There didn't feel like there could be some enlargement of the DIP joints of both hands.   Neurological: She is alert and oriented to person, place, and time.  Psychiatric:  She has a normal mood and affect. Her behavior is normal.  Patient seen very anxious today.          Assessment & Plan:  Bilateral hand stiffness/pain-will do rheumatoid arthritis panel. Sent patient for bilateral hand x-rays as well as blood work for autoimmune dysfunction. Will call patient with results. Follow up in 4-6 weeks.   DM-managed by endocrinology  Hypothyroidism-managed by endocrinology  ADHD/anxiety/Depression-managed by psychiatry. PHQ-9 was 23. Pt needs to follow up ASAP.

## 2013-07-05 LAB — RHEUMATOID FACTOR: Rhuematoid fact SerPl-aCnc: 13 IU/mL (ref <=14)

## 2013-07-05 LAB — SEDIMENTATION RATE: SED RATE: 4 mm/h (ref 0–22)

## 2013-07-07 LAB — CYCLIC CITRUL PEPTIDE ANTIBODY, IGG

## 2013-07-07 LAB — ANA: Anti Nuclear Antibody(ANA): NEGATIVE

## 2013-07-08 ENCOUNTER — Ambulatory Visit
Admission: RE | Admit: 2013-07-08 | Discharge: 2013-07-08 | Disposition: A | Discharge status: Home / Self Care | Payer: Medicaid Other | Source: Ambulatory Visit | Attending: Neurosurgery | Admitting: Neurosurgery

## 2013-07-08 VITALS — BP 131/76 | HR 96

## 2013-07-08 DIAGNOSIS — M5416 Radiculopathy, lumbar region: Secondary | ICD-10-CM

## 2013-07-08 DIAGNOSIS — F329 Major depressive disorder, single episode, unspecified: Secondary | ICD-10-CM | POA: Insufficient documentation

## 2013-07-08 DIAGNOSIS — M545 Low back pain, unspecified: Secondary | ICD-10-CM

## 2013-07-08 DIAGNOSIS — F32A Depression, unspecified: Secondary | ICD-10-CM | POA: Insufficient documentation

## 2013-07-08 MED ORDER — METHYLPREDNISOLONE ACETATE 40 MG/ML INJ SUSP (RADIOLOG
120.0000 mg | Freq: Once | INTRAMUSCULAR | Status: AC
  Administered 2013-07-08: 120 mg via EPIDURAL

## 2013-07-08 MED ORDER — IOHEXOL 180 MG/ML  SOLN
1.0000 mL | Freq: Once | INTRAMUSCULAR | Status: AC | PRN
  Administered 2013-07-08: 1 mL via EPIDURAL

## 2013-07-08 NOTE — Discharge Instructions (Signed)

## 2013-07-24 ENCOUNTER — Ambulatory Visit (INDEPENDENT_AMBULATORY_CARE_PROVIDER_SITE_OTHER): Payer: Self-pay | Admitting: Family Medicine

## 2013-07-24 VITALS — BP 106/70 | Wt 170.0 lb

## 2013-07-24 DIAGNOSIS — E119 Type 2 diabetes mellitus without complications: Secondary | ICD-10-CM

## 2013-07-24 NOTE — Progress Notes (Signed)
Patient presents today for 3 month diabetes follow-up as part of the employer-sponsored [employer] program. Current diabetes regimen includes Metformin and Novolog Mix. Patient also continues on daily ARB. ASA is not needed at this time and patient reports cholesterol being under good control. MD has not prescribed a statin at this time. Most recent MD follow-up was Oct 2014. Patient has a pending appt for April 2015. No med changes or major health changes at this time.   Diabetes Assessment: Type of Diabetes: Type 2; Sees Diabetes provider 4 or more times per year; MD managing Diabetes Dr. Dorisann Frames, endo; does not take an aspirin a day; Lowest CBG 32; hypoglycemia frequency occassional; highest CBG >250 Other Diabetes History: Current med regimen includes Metformin ER 500 mg, prescribed as two tabs twice daily; however, patient is taking only three tabs daily per MD instruction accoriding to pt. She also continues on Novolog 70/30 Mix, taking 10 units AM and 12 units PM. Patient is compliant with oral medications, but often forgets to take her insulin prior to eating. She usually remembers but often does not dose at the correct time. Patient did not bring meter today but is currently testing 3-4 times per day. Glucose monitoring occurs fasting (sometimes),pre-prandial, and when symptomatic. Hypoglycemia frequency is occassional, occuring once or less per week. Most recenlty patient experienced a low of 32 after taking insulin and eating only a banana. Patient does demonstrate appropriate correction of hypoglycemia. On average patient reports hyperglycemia with frequent readings greater than 200. Patient had a recent steroid injetion and glucose has been considerably elevated over the previous week but is normalizing at this time. Patient reports signs and symptoms of neuropathy including tingling of toes. She has noticed these symptoms since being diagnosed as diabetic, but does report they have  improved with improving A1c. Patient is due for yearly eye exam. She had a full diabetic exam April 2014.   Lifestyle Factors: Diet - Patient admits that diet has deteriorated and could use improvement. Due to hectic schedule (her children are participating in sports) she has been eating more fast food and sometimes skipping meals altogether. She also craves sweets on a regular basis and often satisfies those cravings with cookies, icecream, etc. On a positive note, patient is now limiting herself to one soda per day and is drinking more water. Previously she has been drinking diet soda only. Food recall is as follows:  Breakfast - eggs, egg and cheese sandwich, toast or bagel thin, with water or coffee Lunch - often eats from the cafeteria, salads, subway Engineer, water - eating on the go more often due to schedule (children playing sports) Vegetable - 3-5 on a regular basis, but less lately due to deteriorating dietary habits Sweet cravings - cookies, icecream, sweets, etc Exercise - Patient walks in her neighborhood twice per week, approximately 3/4 mile or 20 min. She does have a gym membership but does not remember the last time she used it. She has considered wearing a pedometer or using a fitness app on her phone to track steps walked. 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 She wants to resume using her gym membership at least once per week.  Assessment: Patient presents today for follow-up. A1c was not checked at today's visit but will be tested in April when pt follows-up with endocrinologist. She has made an effort at small dietary changes, but continues to have room for improvement. Exercise is  sporadic as well, but patient seems committed to improving compliance in this area. Also of note, patient continues smoking but has set a new quit date of 07/28/13. We have set a series of goals and I am hopeful pt will be able to continue to make positive  lifestyle changes. She will follow-up with Toni AmendBen Brown in 3 months.  Plan: 1) Attempt to eat a more healthy diet and make more healthy choices 2) Reduce sweets 3) Continue walking at least twice per week and add one day of gym 4) Continue testing 5) Increase Metformin to two twice daily and attempt to remember to dose insulin prior to eating 6) Keep quit date of Monday 07/28/13!! 7) Follow-up with Romeo AppleBen on Tuesday June 23rd @ 11:00

## 2013-08-12 ENCOUNTER — Emergency Department
Admission: EM | Admit: 2013-08-12 | Discharge: 2013-08-12 | Disposition: A | Discharge status: Home / Self Care | Payer: 59 | Source: Home / Self Care | Attending: Emergency Medicine | Admitting: Emergency Medicine

## 2013-08-12 ENCOUNTER — Emergency Department (INDEPENDENT_AMBULATORY_CARE_PROVIDER_SITE_OTHER): Payer: 59

## 2013-08-12 ENCOUNTER — Encounter: Payer: Self-pay | Admitting: Emergency Medicine

## 2013-08-12 DIAGNOSIS — IMO0002 Reserved for concepts with insufficient information to code with codable children: Secondary | ICD-10-CM

## 2013-08-12 DIAGNOSIS — S60221A Contusion of right hand, initial encounter: Principal | ICD-10-CM

## 2013-08-12 DIAGNOSIS — S6000XA Contusion of unspecified finger without damage to nail, initial encounter: Secondary | ICD-10-CM

## 2013-08-12 DIAGNOSIS — S60229A Contusion of unspecified hand, initial encounter: Secondary | ICD-10-CM

## 2013-08-12 DIAGNOSIS — M79609 Pain in unspecified limb: Secondary | ICD-10-CM

## 2013-08-12 DIAGNOSIS — S60511A Abrasion of right hand, initial encounter: Secondary | ICD-10-CM

## 2013-08-12 MED ORDER — TRAMADOL-ACETAMINOPHEN 37.5-325 MG PO TABS: ORAL_TABLET | ORAL | Status: AC

## 2013-08-12 NOTE — ED Provider Notes (Signed)
CSN: [id number]     Arrival date & time 08/12/13  0854 History   First MD Initiated Contact with Patient 08/12/13 [phone]     Chief Complaint  Patient presents with  . Hand Injury  Pt c/o RT hand injury x this morning. She reports closing her hand in a door. No OTC meds  HPI Pain is sharp, moderately severe. Right after the injury, she irrigated her hand with saline . Mild diffuse numbness right hand. Decreased range of motion because of pain and swelling. Denies wrist pain. She states her tetanus immunizations up to date, last tetanus shot about one year ago. She works as a Statistician.  Past Medical History  Diagnosis Date  . Diabetes mellitus   . Hypothyroid     Hx Graves Dz age 33.  s/p iodine radiactive  . Anxiety   . Allergic rhinitis due to pollen   . Hyperlipidemia   . Obesity   . IBS (irritable bowel syndrome)   . ADHD (attention deficit hyperactivity disorder)    Past Surgical History  Procedure Laterality Date  . Cholecystectomy  2007  . Tonsillectomy      33 YRS OLD  . Appendectomy     Family History  Problem Relation Age of Onset  . Hypertension Mother   . Diabetes Mother   . Depression Mother   . Hypertension Father   . Diabetes Father   . Multiple myeloma Paternal Grandmother   . Breast cancer Maternal Grandmother   . Diabetes Maternal Grandmother   . Breast cancer      paternal great-grandmother  . Diabetes Paternal Grandfather    History  Substance Use Topics  . Smoking status: Current Every Day Smoker -- 1.00 packs/day    Types: Cigarettes  . Smokeless tobacco: Never Used     Comment: started smoking at age 33.  Currently smoking 1-1 1/2 ppd.  . Alcohol Use: Yes     Comment: socially   OB History   Grav Para Term Preterm Abortions TAB SAB Ect Mult Living                 Review of Systems  All other systems reviewed and are negative.   Allergies  Review of patient's allergies indicates no known allergies.  Home Medications     Prior to Admission medications   Medication Sig Start Date End Date Taking? Authorizing Provider  amphetamine-dextroamphetamine (ADDERALL) 30 MG tablet Take 30 mg by mouth 2 (two) times daily. 1 tab AM and 1 tab at noon    Historical Provider, MD  Calcium Carbonate-Vit D-Min (CALTRATE 600+D PLUS) 600-400 MG-UNIT per tablet Chew 1 tablet by mouth daily.      Historical Provider, MD  citalopram (CELEXA) 40 MG tablet Take 40 mg by mouth daily.    Historical Provider, MD  insulin aspart protamine-insulin aspart (NOVOLOG 70/30) (70-30) 100 UNIT/ML injection Inject 10 Units into the skin 2 (two) times daily. 10 Units in AM and 12 Units in PM    Historical Provider, MD  levothyroxine (SYNTHROID, LEVOTHROID) 200 MCG tablet Take 200 mcg by mouth daily before breakfast.    Historical Provider, MD  losartan (COZAAR) 25 MG tablet TAKE 1 TABLET (25 MG TOTAL) BY MOUTH DAILY. * Needs appointment for additional refills* 09/27/12   Owens Loffler, MD  metFORMIN (GLUCOPHAGE-XR) 500 MG 24 hr tablet Take 1,500 mg by mouth daily with breakfast.    Historical Provider, MD   BP 124/82  Pulse 68  Temp(Src) 98.1  F (36.7 C) (Oral)  Resp 16  Wt 169 lb (76.658 kg)  SpO2 100%  LMP 08/12/2013 Physical Exam  Nursing note and vitals reviewed. Constitutional: She is oriented to person, place, and time. She appears well-developed and well-nourished. No distress.  Alert, very uncomfortable from right hand injury.  HENT:  Head: Normocephalic and atraumatic.  Eyes: Conjunctivae and EOM are normal. Pupils are equal, round, and reactive to light. No scleral icterus.  Neck: Normal range of motion.  Cardiovascular: Normal rate.   Pulmonary/Chest: Effort normal.  Abdominal: She exhibits no distension.  Musculoskeletal:       Right wrist: Normal. She exhibits normal range of motion, no tenderness and no swelling.       Right hand: She exhibits decreased range of motion, bony tenderness and swelling. She exhibits normal  capillary refill.       Hands: In area depicted,The dorsum of right hand is very swollen tender ecchymotic with superficial skin abraded, several thin flaps of skin. No deep wound or laceration or any deep epidermal avulsion.  Tendons tested and intact. Neurovascular distally intact    Neurological: She is alert and oriented to person, place, and time.  Skin: Skin is warm.  Psychiatric: She has a normal mood and affect.    ED Course  Procedures (including critical care time) Labs Review Labs Reviewed - No data to display  Imaging Review Dg Hand Complete Right  08/12/2013   CLINICAL DATA:  Pain post trauma  EXAM: RIGHT HAND - COMPLETE 3+ VIEW  COMPARISON:  July 04, 2013  FINDINGS: Frontal, oblique, and lateral views were obtained. There is no fracture or dislocation. Joint spaces appear intact. There is a bone island in the proximal capitate bone.  IMPRESSION: No fracture or dislocation.  No appreciable arthropathy.   Electronically Signed   By: Lowella Grip M.D.   On: 08/12/2013 09:20     MDM   1. Contusion of right hand including fingers   2. Abrasion of right hand     X-ray right hand negative. No fracture or dislocation.--Reviewed with patient and gave her copy of x-ray report. Treatment options discussed, as well as risks, benefits, alternatives. Patient voiced understanding and agreement with the following plans:  Wound cleansed with Hibiclens. No foreign body. There were several flaps of thinly avulsed skin, and after risks benefits alternatives discussed, patient agreed with the following procedure: I carefully debrided devitalized skin.--She tolerated well Polysporin dressing. Nonstick dressing. Wrap with Coban and an Ace bandage. Encourage rest, ice, compression with ACE bandage, and elevation of injured body part. She declined prescription for NSAID, she prefers to take 2 Aleve twice a day p.c. when necessary moderate pain. Prescription for Ultracet when necessary  severe pain  She works as a Statistician. In my opinion she may not work for the next 3 days through 08/14/2013. Note written. Advise followup with sports medicine or orthopedist in 3 days, sooner prn Precautions discussed. Red flags discussed. Questions invited and answered. Patient voiced understanding and agreement.   Jacqulyn Cane, MD 08/12/13 1012

## 2013-08-12 NOTE — ED Notes (Signed)
Pt c/o RT hand injury x this morning. She reports closing her hand in a door. No OTC meds.

## 2013-10-21 IMAGING — CT CT ABD-PELV W/ CM
2 of 4 series · 17 of 46 positions shown, 19 images · IV contrast (APPLIED)
Comparison: 09/29/2008

CLINICAL DATA: Epigastric and right upper quadrant pain.

CT ABDOMEN AND PELVIS WITH CONTRAST
TECHNIQUE: Multidetector CT imaging of the abdomen and pelvis was
performed following the standard protocol during bolus
administration of intravenous contrast.
Contrast: 100mL OMNIPAQUE IOHEXOL 300 MG/ML  SOLN

[Series 2: abd/pelv with 5.0 b31f st · axial · 0.69mm/px · z∈[-480,-30]mm · 14 of 98 slices shown, 16 images]
[im 4/98  soft-tissue]
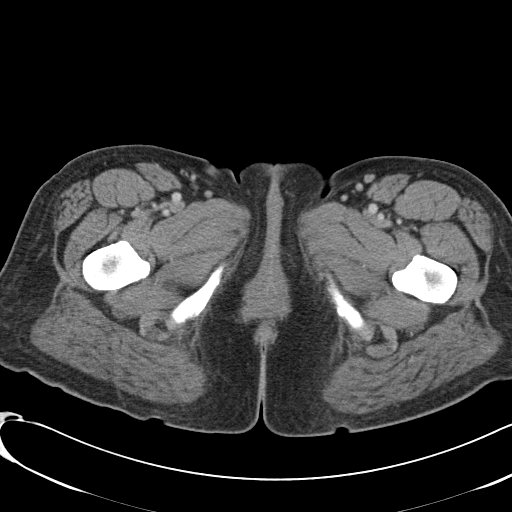
[im 4/98  bone]
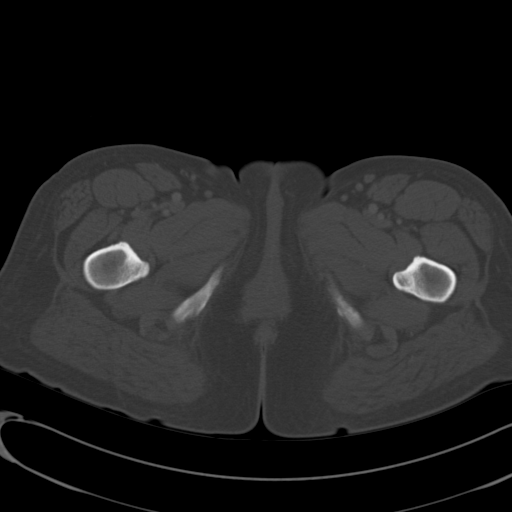
[im 12/98  soft-tissue]
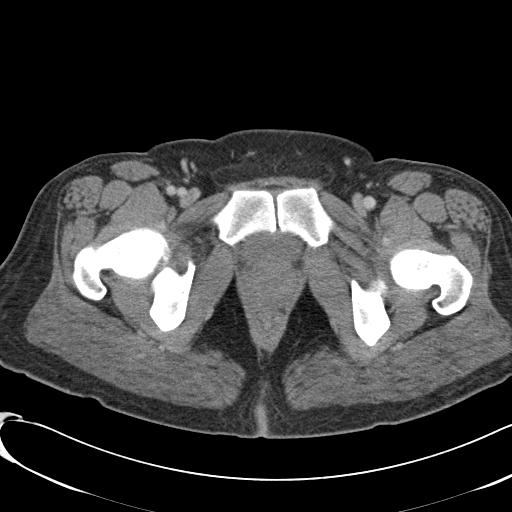
[im 19/98  soft-tissue]
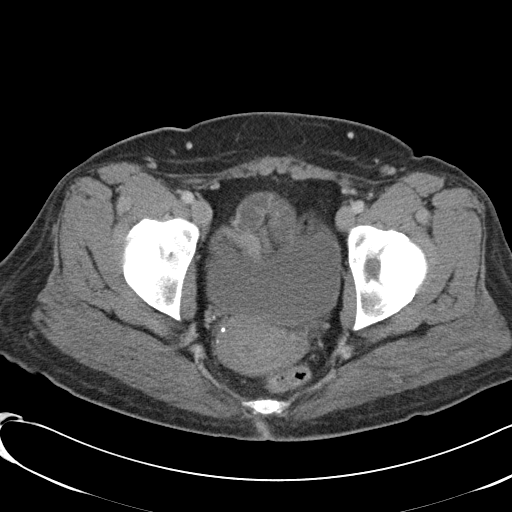
[im 27/98  soft-tissue]
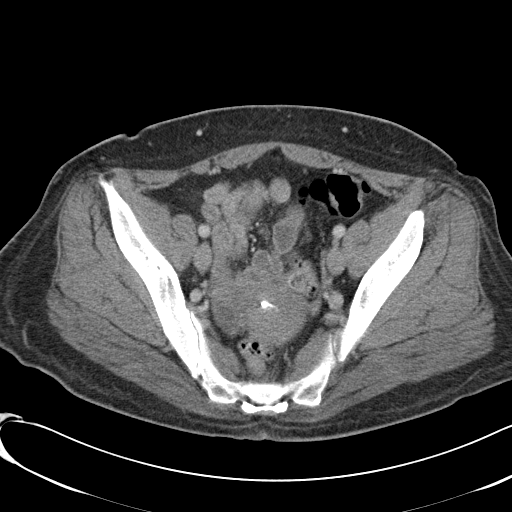
[im 34/98  soft-tissue]
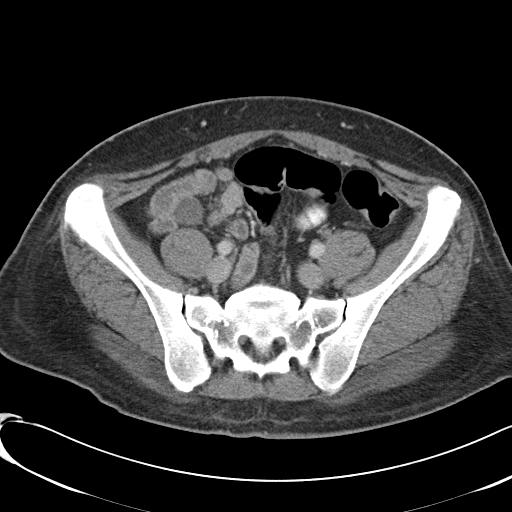
[im 38/98  soft-tissue]
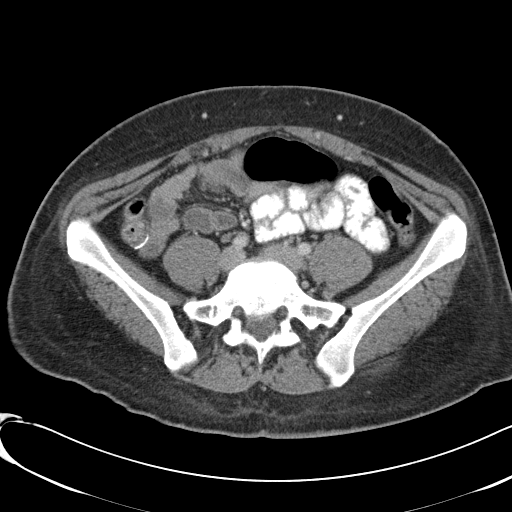
[im 45/98  soft-tissue]
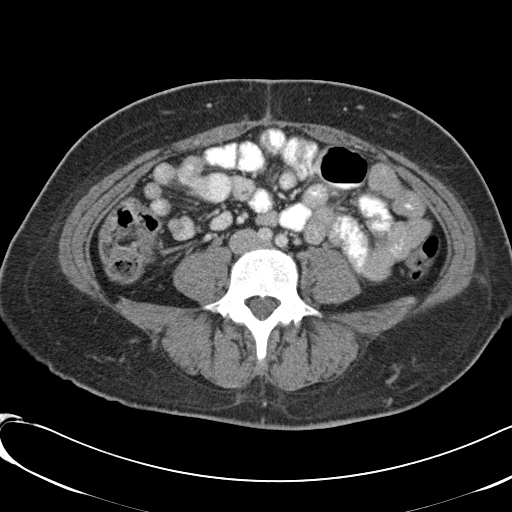
[im 53/98  soft-tissue]
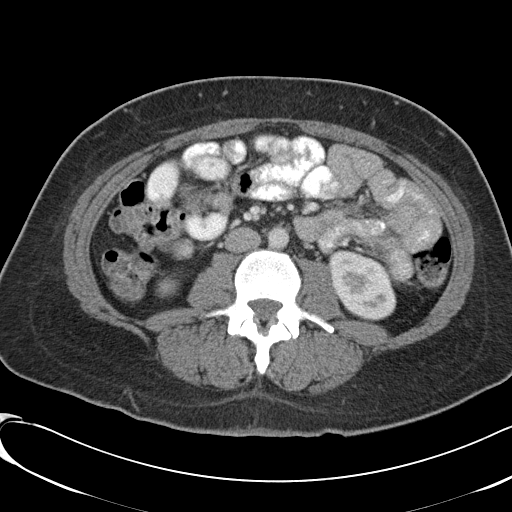
[im 60/98  soft-tissue]
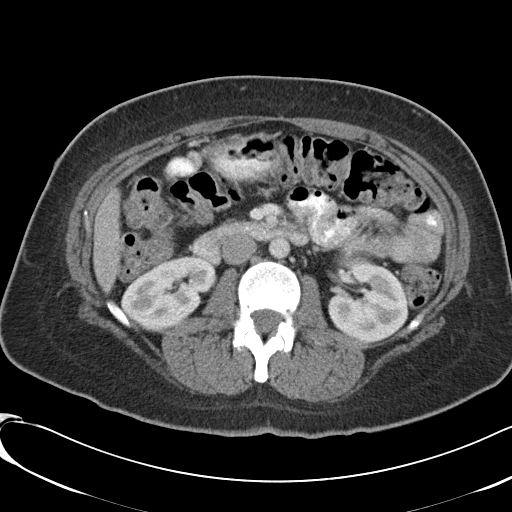
[im 60/98  bone]
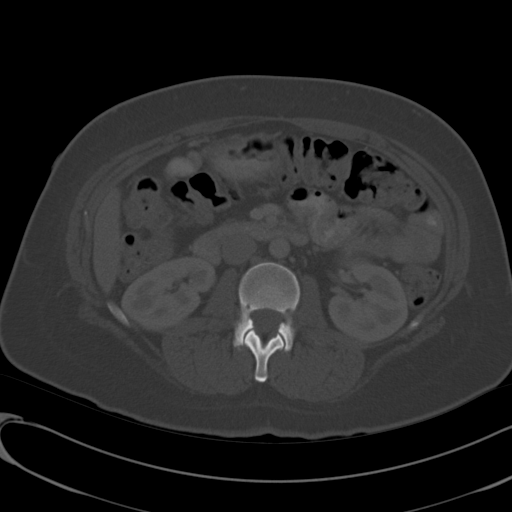
[im 64/98  soft-tissue]
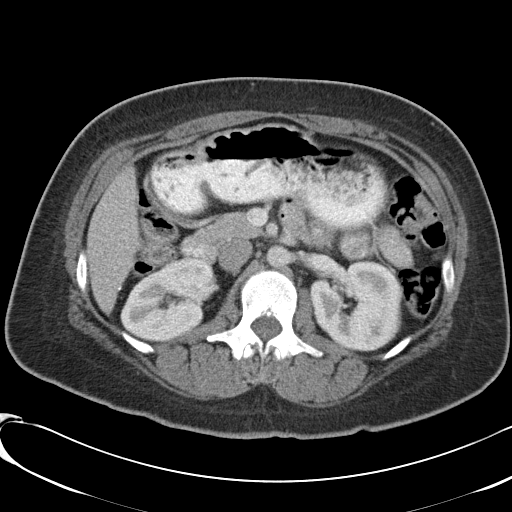
[im 71/98  soft-tissue]
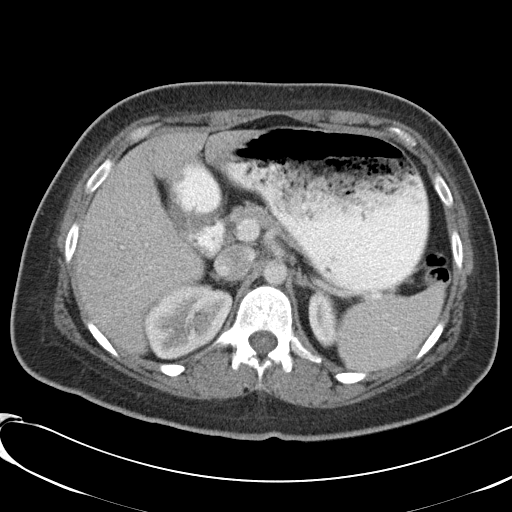
[im 79/98  soft-tissue]
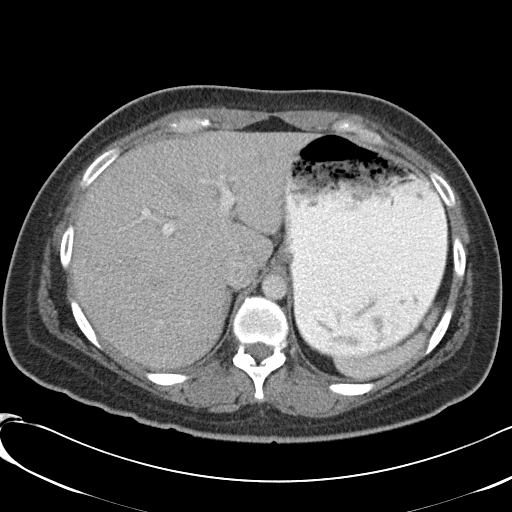
[im 86/98  soft-tissue]
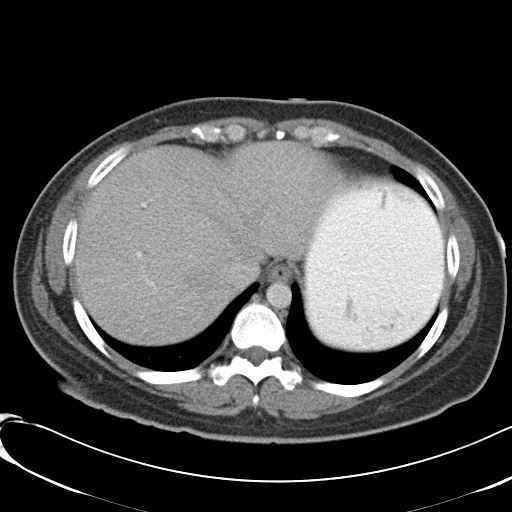
[im 94/98  soft-tissue]
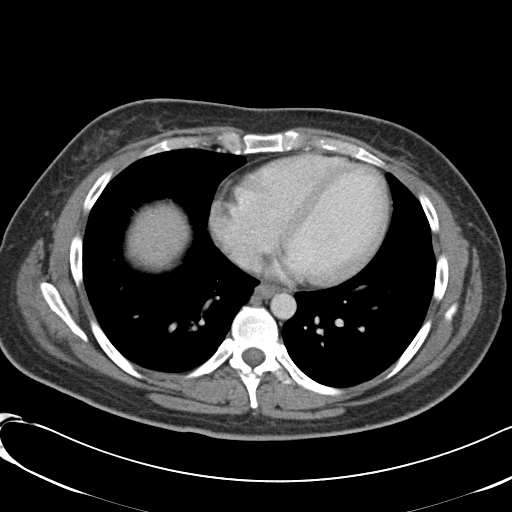

[Series 602: coronal abd./pel · coronal · 0.96mm/px · 3 of 81 slices shown]
[im 27/81  soft-tissue]
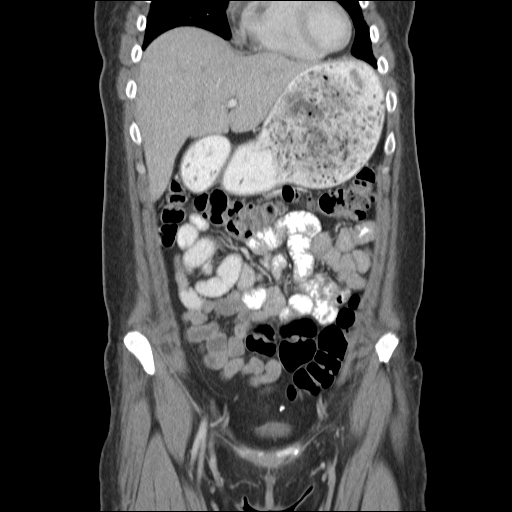
[im 36/81  soft-tissue]
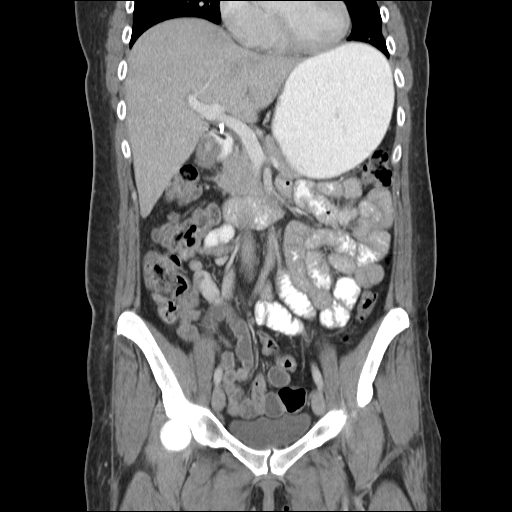
[im 45/81  soft-tissue]
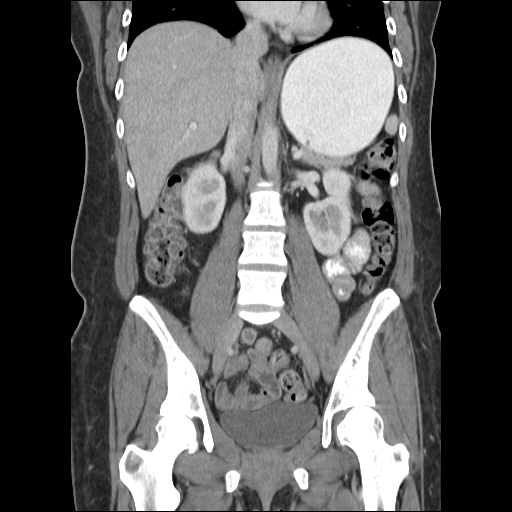

[17 of 46 positions shown; findings below may reference images not displayed]

FINDINGS: Visualized lung bases clear.  There are clips in the
gallbladder fossa. Unremarkable liver, spleen, adrenal glands,
kidneys, pancreas, abdominal aorta.  Stomach is distended by
ingested material.  Small bowel decompressed.  Vascular   clip near
the base of the cecum.  Appendix is   surgically absent.  The colon
is nondilated, unremarkable.  IUD projects in expected location.
Adnexal regions unremarkable.  Urinary bladder incompletely
distended.  There is no ascites.  No free air.  No adenopathy
localized. Lumbar spine intact.]
IMPRESSION: 1. No acute abdominal process.
2.  Postoperative changes as above.}

## 2013-12-01 ENCOUNTER — Encounter: Payer: Self-pay | Admitting: Family Medicine

## 2013-12-01 NOTE — Progress Notes (Signed)
Patient ID: April Cortez, female   DOB: [birth year], 33 y.o.   MRN: [id number] Reviewed: Agree with our Pharmacologist's documentation and management.

## 2014-02-09 ENCOUNTER — Other Ambulatory Visit: Payer: Self-pay | Admitting: Obstetrics and Gynecology

## 2014-02-09 ENCOUNTER — Other Ambulatory Visit: Payer: Self-pay

## 2014-02-09 DIAGNOSIS — N644 Mastodynia: Secondary | ICD-10-CM

## 2014-02-10 LAB — CYTOLOGY - PAP

## 2014-02-12 ENCOUNTER — Ambulatory Visit
Admission: RE | Admit: 2014-02-12 | Discharge: 2014-02-12 | Disposition: A | Discharge status: Home / Self Care | Payer: Medicaid Other | Source: Ambulatory Visit | Attending: Obstetrics and Gynecology | Admitting: Obstetrics and Gynecology

## 2014-02-12 ENCOUNTER — Encounter (INDEPENDENT_AMBULATORY_CARE_PROVIDER_SITE_OTHER): Payer: Self-pay

## 2014-02-12 ENCOUNTER — Ambulatory Visit: Payer: 59

## 2014-02-12 DIAGNOSIS — N644 Mastodynia: Secondary | ICD-10-CM

## 2014-02-16 ENCOUNTER — Other Ambulatory Visit: Payer: Medicaid Other

## 2014-06-26 ENCOUNTER — Other Ambulatory Visit: Payer: Medicaid Other

## 2014-07-16 ENCOUNTER — Ambulatory Visit
Admission: RE | Admit: 2014-07-16 | Discharge: 2014-07-16 | Disposition: A | Discharge status: Home / Self Care | Payer: Medicaid Other | Source: Ambulatory Visit | Attending: Obstetrics and Gynecology | Admitting: Obstetrics and Gynecology

## 2014-07-16 DIAGNOSIS — N644 Mastodynia: Secondary | ICD-10-CM
